# Patient Record
Sex: Female | Born: 1935 | Race: Black or African American | Hispanic: No | Smoking: Never smoker
Health system: Southern US, Community
[De-identification: ages and names within clinical notes are randomized; demographics above are authoritative.]

## PROBLEM LIST (undated history)

## (undated) DIAGNOSIS — I10 Essential (primary) hypertension: Secondary | ICD-10-CM

## (undated) DIAGNOSIS — I6529 Occlusion and stenosis of unspecified carotid artery: Secondary | ICD-10-CM

## (undated) DIAGNOSIS — E785 Hyperlipidemia, unspecified: Secondary | ICD-10-CM

## (undated) HISTORY — DX: Hyperlipidemia, unspecified: E78.5

## (undated) HISTORY — DX: Occlusion and stenosis of unspecified carotid artery: I65.29

## (undated) HISTORY — DX: Essential (primary) hypertension: I10

## (undated) HISTORY — PX: ABDOMINAL HYSTERECTOMY: SHX81

---

## 2002-06-23 ENCOUNTER — Other Ambulatory Visit: Admission: RE | Admit: 2002-06-23 | Discharge: 2002-06-23 | Payer: Self-pay | Admitting: Family Medicine

## 2003-10-18 ENCOUNTER — Ambulatory Visit (HOSPITAL_COMMUNITY): Admission: RE | Admit: 2003-10-18 | Discharge: 2003-10-18 | Payer: Self-pay | Admitting: Gastroenterology

## 2004-09-01 ENCOUNTER — Encounter: Admission: RE | Admit: 2004-09-01 | Discharge: 2004-09-01 | Payer: Self-pay | Admitting: Family Medicine

## 2006-07-22 ENCOUNTER — Ambulatory Visit (HOSPITAL_COMMUNITY): Admission: RE | Admit: 2006-07-22 | Discharge: 2006-07-22 | Payer: Self-pay | Admitting: Gastroenterology

## 2006-07-22 ENCOUNTER — Encounter (INDEPENDENT_AMBULATORY_CARE_PROVIDER_SITE_OTHER): Payer: Self-pay | Admitting: Gastroenterology

## 2006-09-03 ENCOUNTER — Observation Stay (HOSPITAL_COMMUNITY): Admission: EM | Admit: 2006-09-03 | Discharge: 2006-09-05 | Payer: Self-pay | Admitting: *Deleted

## 2006-09-03 ENCOUNTER — Ambulatory Visit: Payer: Self-pay | Admitting: Internal Medicine

## 2006-09-04 ENCOUNTER — Ambulatory Visit: Payer: Self-pay | Admitting: Vascular Surgery

## 2006-09-04 ENCOUNTER — Encounter (INDEPENDENT_AMBULATORY_CARE_PROVIDER_SITE_OTHER): Payer: Self-pay | Admitting: Internal Medicine

## 2006-10-01 ENCOUNTER — Ambulatory Visit: Payer: Self-pay | Admitting: Vascular Surgery

## 2007-04-01 ENCOUNTER — Ambulatory Visit: Payer: Self-pay | Admitting: Vascular Surgery

## 2007-09-30 ENCOUNTER — Ambulatory Visit: Payer: Self-pay | Admitting: Vascular Surgery

## 2008-03-09 ENCOUNTER — Ambulatory Visit: Payer: Self-pay | Admitting: Vascular Surgery

## 2008-09-14 ENCOUNTER — Ambulatory Visit: Payer: Self-pay | Admitting: Vascular Surgery

## 2009-03-31 ENCOUNTER — Ambulatory Visit: Payer: Self-pay | Admitting: Vascular Surgery

## 2009-10-12 ENCOUNTER — Ambulatory Visit: Payer: Self-pay | Admitting: Vascular Surgery

## 2010-04-02 ENCOUNTER — Emergency Department (HOSPITAL_COMMUNITY): Payer: No Typology Code available for payment source

## 2010-04-02 ENCOUNTER — Emergency Department (HOSPITAL_COMMUNITY)
Admission: EM | Admit: 2010-04-02 | Discharge: 2010-04-02 | Disposition: A | Discharge status: Home / Self Care | Payer: No Typology Code available for payment source | Attending: Emergency Medicine | Admitting: Emergency Medicine

## 2010-04-02 DIAGNOSIS — S0990XA Unspecified injury of head, initial encounter: Secondary | ICD-10-CM | POA: Insufficient documentation

## 2010-04-02 DIAGNOSIS — Z79899 Other long term (current) drug therapy: Secondary | ICD-10-CM | POA: Insufficient documentation

## 2010-04-02 DIAGNOSIS — H409 Unspecified glaucoma: Secondary | ICD-10-CM | POA: Insufficient documentation

## 2010-04-02 DIAGNOSIS — M545 Low back pain, unspecified: Secondary | ICD-10-CM | POA: Insufficient documentation

## 2010-04-02 DIAGNOSIS — I10 Essential (primary) hypertension: Secondary | ICD-10-CM | POA: Insufficient documentation

## 2010-04-02 DIAGNOSIS — E119 Type 2 diabetes mellitus without complications: Secondary | ICD-10-CM | POA: Insufficient documentation

## 2010-04-02 DIAGNOSIS — S1093XA Contusion of unspecified part of neck, initial encounter: Secondary | ICD-10-CM | POA: Insufficient documentation

## 2010-04-02 DIAGNOSIS — S0003XA Contusion of scalp, initial encounter: Secondary | ICD-10-CM | POA: Insufficient documentation

## 2010-04-02 DIAGNOSIS — E785 Hyperlipidemia, unspecified: Secondary | ICD-10-CM | POA: Insufficient documentation

## 2010-05-23 NOTE — Consult Note (Signed)
VASCULAR SURGERY CONSULTATION   [name], [name]  DOB:  [birth year]                                       10/01/2006  CHART#:[id number]   I saw the patient in the office today in consultation concerning her  carotid disease.  She was referred by Dr. Parke Simmers.  This is a pleasant 76-  year-old right-handed woman who was admitted on September 03, 2006 to [facility] after she had an episode of syncope while having her hair cut.  She  underwent an extensive workup while in the hospital, including a carotid  duplex scan, which showed evidence of a 40-60% left carotid stenosis and  a 60-80% right carotid stenosis.  Workup also included a head CT, which  was unremarkable.  A 2D echo showed normal LV function with an EF of  65%.  It was not felt that her symptoms were related to her diabetes.  It was felt that perhaps her symptoms were related to hypotension in her  medications, and her medications have subsequently been adjusted.  She  was sent here for vascular consultation concerning her carotid disease.  Of note, she denies any history of stroke, TIAs, expressive or receptive  aphasia, or amaurosis fugax.   PAST MEDICAL HISTORY:  Adult-onset diabetes.  She does not require  insulin.  In addition, she has hypertension and hypercholesterolemia.  She denies any history of myocardial infarction or history of congestive  heart failure.   FAMILY HISTORY:  There is no history of premature cardiovascular  disease.   SOCIAL HISTORY:  She is single.  She has three children.  She does not  use tobacco.   MEDICATIONS:  Documented on the medical history form in her chart.   REVIEW OF SYSTEMS:  Documented on the medical history form in her chart.   PHYSICAL EXAMINATION:  Blood pressure is 151/80 on the left and 141/71on  the right.  Heart rate is 108.  She has a right carotid bruit.  Lungs  are clear bilaterally to auscultation.  On cardiac exam, she has a  regular rate and  rhythm.  Her abdomen is soft and nontender.  I could  not palpate an aneurysm.  She has palpable femoral and dorsalis pedis  pulses bilaterally.  She has no significant lower extremity swelling.  Her neurologic exam is nonfocal.   Her carotid duplex at [facility] suggested a 60-79% right carotid  stenosis in the mid range.  Of note, the highest velocities were in the  distal internal carotid artery.  On the left side, she had a 40-60%  stenosis.   I have explained that I would generally not consider carotid  endarterectomy unless the stenosis progressed to greater than 80% or she  developed new neurologic symptoms.  I have recommended a follow-up  duplex scan in six months and will see her back at that time.  She does  know to continue taking her aspirin.  She understands we would consider  surgery sooner if she had right hemispheric symptoms.  I have reviewed  these symptoms with her.  I did reassure her that I did not think the  episode she had while getting her hair cut was related to her right  carotid stenosis.   [name], M.D.  Electronically Signed  CSD/MEDQ  D:  10/01/2006  T:  10/02/2006  Job:  [id number]

## 2010-05-23 NOTE — Procedures (Signed)
CAROTID DUPLEX EXAM   INDICATION:  Followup of known carotid artery disease.  Patient is  asymptomatic.   HISTORY:  Diabetes:  Yes.  Cardiac:  No.  Hypertension:  Yes.  Smoking:  No.  Previous Surgery:  No.  CV History:  Amaurosis Fugax No, Paresthesias No, Hemiparesis No                                       RIGHT                LEFT  Brachial systolic pressure:         132                  138  Brachial Doppler waveforms:         WNL                  WNL  Vertebral direction of flow:        Antegrade            Antegrade  DUPLEX VELOCITIES (cm/sec)  CCA peak systolic                   93                   99  ECA peak systolic                   129                  108  ICA peak systolic                   94 (P), 78 (M), 216 (D)             107  ICA end diastolic                   25 (P), 28 (M), 55 (D)              36  PLAQUE MORPHOLOGY:                  Homogeneous          Heterogeneous  PLAQUE AMOUNT:                      Minimal              Mild to  moderate.  PLAQUE LOCATION:                    ICA                  DCCA,  bifurcation, ICA   IMPRESSION:  1. Right 60-79% ICA stenosis with highest velocity taken at the distal      ICA.  2. Left 1-39% ICA stenosis (upper end of range).  3. Patent ECAs.  4. Bilateral antegrade flow in vertebral arteries.   ___________________________________________  Di Kindle. Edilia Bo, M.D.   PB/MEDQ  D:  04/01/2007  T:  04/01/2007  Job:  [id number]

## 2010-05-23 NOTE — Discharge Summary (Signed)
[name], [name]              ACCOUNT NO.:  [id number]   MEDICAL RECORD NO.:  [id number]          PATIENT TYPE:  OBV   LOCATION:  4707                         FACILITY:  [facility]   PHYSICIAN:  Mobolaji B. Bakare, M.D.DATE OF BIRTH:  [birth year]   DATE OF ADMISSION:  09/03/2006  DATE OF DISCHARGE:  09/05/2006                               DISCHARGE SUMMARY   PRIMARY CARE PHYSICIAN:  Renaye Rakers, M.D.   GASTROENTEROLOGIST:  Anselmo Rod, M.D.   DATE OF ADMISSION:  36 of August 2000 and in date of discharge 28 of  August 2008.   FINAL DIAGNOSES:  1. Presyncope.  2. Right carotid artery stenosis 60-80%.  3. Left carotid artery stenosis 40-60%.  4. Diabetes mellitus.  5. Normocytic anemia.  6. Hypertension.   PROCEDURE:  1. Head CT scan done September 03, 2006, showed no acute intracranial      abnormality.  2. Chest x-ray done on September 03, 2006, showed no active      cardiopulmonary disease.  3. A 2-D echocardiogram done on September 04, 2006, showed normal left      ventricular systolic function with estimated ejection fraction of      65%.  There was increased left ventricular wall thickness and      moderate focal basilar septal hypertrophy.  The pulmonary artery      pressure was mildly increased 36 mmHg.  4. Carotid Dopplers done on the September 04, 2006, showed 60-80%      internal carotid artery stenosis (medium range of scale).External      carotid artery stenosis on the right.  Left showed 40-60% internal      carotid artery stenosis (upper range of scale), external  carotid artery stenosis.  The vertebral artery flow his antegrade  bilaterally   BRIEF HISTORY:  [name] is a pleasant 76 year old African American  female with known history of diabetes mellitus, hypertension and  hyperlipidemia all of which appears to have been fairly under control.  She was in her usual state of health until August 14, 2006, when she went  to the [employer], and while sitting on a  chair, she felt dizzy all of  a sudden, felt like she was going to pass out, but she did not pass out.  She was also sweaty.  The hair dresser called EMS.  Upon arrival by EMS,  the patient's blood glucose was 147.  The patient was then transported  to the emergency room.  On arrival in the emergency room, head CT scan  was nonacute.  The patient was oriented to time, place and person.  EKG  showed no acute ischemic changes.  She had a normal D-dimer.  She was  not short of breath.  Symptoms have completely resolved.  The patient  was admitted for evaluation on telemetry.   On the day of admission prior to going to the hair dresser, the patient  had a breakfast about 7:00 a.m. and used all her medications, Avandia  and metformin.  She went for a regular aerobics, came back about 9:30,  had some crackers and  went to hair dresser place.  She has also noted  that her fasting blood glucose has been in the mid 100s.   HOSPITAL COURSE:  #1 - PRESYNCOPE.  The patient was admitted to  telemetry.  She had sinus rhythm for the time.  She had sinus rhythm on  telemetry.  She ruled out for myocardial infarction with three sets of  negative cardiac enzymes.  EKG was nonacute, normal sinus rhythm.  Although there were Q-waves in V1 and V2.  She had normal QTC interval.  She had a 2-D echocardiogram which was unremarkable for significant  valvular disease.  Carotid Dopplers noted bilateral carotid artery  stenosis, more on the right than on the left.  The patient denied  difficulty with speech, weakness, visual changes, headaches at the time  of episode.  I do not think the carotid stenosis is responsible for the  presyncope.  Nevertheless, this needs to be followed up.  She was not  orthostatic.  Overall, the etiology of presyncope is unclear.  It is  quite possible that she had relative hypoglycemia at the hair dresser's  place.  It was noted during the course of hospitalization that blood  glucose  ranged between 103 and 118, despite the fact that we did not  resume all oral hypoglycemic agent.  There was no recurrence of symptoms  in the hospital.   #2 - BILATERAL CAROTID ARTERY STENOSIS.  The patient was noted on  presyncopal workup to have 60-80% stenosis on the right and 40-60%  stenosis on the left, report is as noted above.  I have arranged an  appointment to follow up with Dr. Edilia Bo on October 01, 2006, at 9:15  a.m.  The patient will continue with aspirin.  Fasting lipid profile is  normal with HDL of 47 and LDL of 75.  She will continue with Vytorin.   #3 - DIABETES MELLITUS.  Hemoglobin A1c was 6.1.  Blood sugar regardless  of fasting or preprandial ranged between 103 and 118 during the course  of hospitalization.  The patient was not started on oral hypoglycemic  agent.  I have instructed to check fasting blood glucose in the morning  and blood glucose at bedtime.  She will continue to hold Avandia and  metformin until she sees Dr. Parke Simmers.  She will resume Starlix 30 mg  before meals.  Further adjustment to oral hypoglycemic agent to be done  by Dr. Parke Simmers.   #4 - NORMOCYTIC ANEMIA.  The patient is undergoing anemia workup.  She  is scheduled to have colonoscopy sometimes next week by Dr. Loreta Ave.  Anemia panel done during this hospitalization revealed TIBC of 342,  ferritin level of 11, this is on the low side of normal, iron is 52,  folate greater than 20, vitamin B12 239.   #5 - HYPERTENSION.  This was controlled during the course of  hospitalization.  Has been controlled at home per patient's report.   DISCHARGE MEDICATIONS:  1. Avalide 150/25 mg one tablet daily.  2. Ecotrin 81 mg daily.  3. Multivitamin one tablet daily.  4. Starlix 60 mg 1/2 tablet before meals.  5. Vytorin 10/40 one daily.  6. Xalatan eye drops both eyes q.h.s.   DISCHARGE LABORATORY DATA:  Hemoglobin A1c 6.1, TSH 1.050, total  cholesterol 134, HDL 47, LDL 75.  Sodium 143, potassium 3.7,  BUN 11,  creatinine 0.67, AST 18, ALT 13, total protein 5.9, albumin 3.0.   DISCHARGE CONDITION:  Stable.  DISCHARGE VITAL SIGNS:  Temperature 98.1, pulse of 81, blood pressure  110/55, O2 sats 100% on room air.      Mobolaji B. Corky Downs, M.D.  Electronically Signed     MBB/MEDQ  D:  09/05/2006  T:  09/06/2006  Job:  [id number]   cc:   Renaye Rakers, M.D.  Anselmo Rod, M.D.  Di Kindle. Edilia Bo, M.D.

## 2010-05-23 NOTE — Op Note (Signed)
[name], [name]              ACCOUNT NO.:  [id number]   MEDICAL RECORD NO.:  [id number]          PATIENT TYPE:  AMB   LOCATION:  ENDO                         FACILITY:  [facility]   PHYSICIAN:  Anselmo Rod, M.D.  DATE OF BIRTH:  1934/06/29   DATE OF PROCEDURE:  07/22/2006  DATE OF DISCHARGE:  07/22/2006                               OPERATIVE REPORT   PROCEDURE:  Esophagogastroduodenoscopy with small-bowel biopsies.   ENDOSCOPIST:  Dr. Anselmo Rod.   INSTRUMENT USED:  Pentax video panendoscope.   INDICATIONS FOR PROCEDURE:  This is a 76 year old African American  female undergoing EGD for iron deficiency anemia; rule out peptic ulcer  disease, esophagitis, gastritis, etc. Small bowel biopsies are planned  to rule out sprue.   PRE-PROCEDURE PREP:  Informed consent was procured from the patient. The  patient was fasted for 8 hours prior to procedure.  The risks and  benefits of the procedure were discussed with the patient in great  detail. The patient had stable vital signs.  Neck was supple.  Chest was  clear to auscultation. S2 regular. Abdomen soft with normal bowel sounds   DESCRIPTION OF THE PROCEDURE:  The patient was placed in the left  lateral decubitus position and sedated with Fentanyl and Versed. Once  the patient was adequately sedated and maintained on low-flow oxygen and  continuous cardiac monitoring, the Pentax video panendoscope was  advanced through the mouthpiece over the tongue into the esophagus under  direct vision. The entire esophagus was widely patent with no evidence  of ring, stricture, mass, esophagitis, Barrett's mucosa.  The scope was  then advanced into the stomach. A small hiatal hernia was seen on high  retroflexion. There was a large sessile polyp that was removed by hot  snare from the midbody of the stomach and retrieved by [name] retrieval  net. The proximal small bowel appeared normal.  Small-bowel biopsies  were done to rule out  sprue. No ulcer, erosions, etc. were identified.  The patient tolerated the procedure well without immediate  complications.   IMPRESSION:  1. Normal-appearing esophagus.  2. Small hiatal hernia.  3. Large sessile polyp removed from midbody of the stomach with a hot      snare.  4. Normal proximal small-bowel biopsies done to rule out sprue.   RECOMMENDATIONS:  1. Await pathology results.  2. Avoid all nonsteroidals, including Aspirin, for the next four      weeks.  3. Proceed with colonoscopy at this time.  4. Further recommendations to be made in followup.      Anselmo Rod, M.D.  Electronically Signed     JNM/MEDQ  D:  07/23/2006  T:  07/24/2006  Job:  [id number]   cc:   Anselmo Rod, M.D.  Fax: [phone]   Renaye Rakers, M.D.  Fax: [phone]

## 2010-05-23 NOTE — Procedures (Signed)
CAROTID DUPLEX EXAM   INDICATION:  Carotid disease.   HISTORY:  Diabetes:  Yes.  Cardiac:  No.  Hypertension:  Yes.  Smoking:  No.  Previous Surgery:  No.  CV History:  Asymptomatic.  Amaurosis Fugax No, Paresthesias No, Hemiparesis No                                       RIGHT             LEFT  Brachial systolic pressure:         62                156  Brachial Doppler waveforms:         Normal            Normal  Vertebral direction of flow:        Antegrade         Antegrade  DUPLEX VELOCITIES (cm/sec)  CCA peak systolic                   148               150  ECA peak systolic                   107               78  ICA peak systolic                   205               150  ICA end diastolic                   45                42  PLAQUE MORPHOLOGY:                                    Heterogeneous  PLAQUE AMOUNT:                      None              Mild  PLAQUE LOCATION:                                      Proximal ICA   IMPRESSION:  1. Doppler velocity suggests at 60% to 79% stenosis of the right      distal internal carotid artery and a 40% to 59% stenosis of the      left distal internal carotid artery.  No focal plaque formation is      noted in the bilateral distal internal carotid arteries.  2. No significant change in the right internal carotid artery when      compared to the previous study on 03/09/2008, with the left      internal carotid artery velocities measuring higher than previously      recorded.   ___________________________________________  Di Kindle. Edilia Bo, M.D.   CH/MEDQ  D:  09/14/2008  T:  09/14/2008  Job:  [id number]

## 2010-05-23 NOTE — Assessment & Plan Note (Signed)
OFFICE VISIT   [name], [name]  DOB:  [birth year]                                       04/01/2007  CHART#:[id number]   HISTORY:  I saw the patient in the office today for continued followup  of her carotid disease.  I had initially seen her in consultation in  September of 2008 with a 60-79% right carotid stenosis.  I explained  that we generally do not consider carotid endarterectomy unless she  developed clear cut right hemispheric symptoms or the stenosis  progressed to greater than 80%.  I recommended a followup duplex scan in  6 months.  She comes in for that followup study.   Since I saw her last in September she has had no history of stroke,  TIAs, expressive or receptive aphasia or amaurosis fugax.  She continues  to take an aspirin a day.   REVIEW OF SYSTEMS:  On review of systems she has had no recent chest  pain, chest pressure, palpitations or arrhythmias.  She has had no  bronchitis, asthma or wheezing.   SOCIAL HISTORY:  Fortunately she is not a smoker.   PHYSICAL EXAMINATION:  General:  This is a pleasant 76 year old woman  who appears her stated age.  Vital signs:  Blood pressure is 142/78,  heart rate is 107.  She has a soft right carotid bruit.  Lungs:  Clear  bilaterally to auscultation.  Cardiovascular:  She has a regular rate  and rhythm.  Abdomen:  Her abdomen is soft and nontender.  Neurological:  Nonfocal.   Carotid duplex scan shows that she has a 60-79% right carotid stenosis  with no significant stenosis on the left.  The velocities on the right  have not changed significantly over the last 6 months.   As she remains asymptomatic I again explained that we would not consider  right carotid endarterectomy unless the stenosis progressed to greater  than 80%.  I will see her back in 6 months for a followup duplex scan.  Of note, if the stenosis does progress it appears that the stenosis is  fairly far distal in the internal  carotid artery and she could  potentially require preoperative cerebral arteriography to be sure that  this is surgically accessible.  I will see her back in 6 months.  She  knows to call sooner if she has problems.  In the meantime she knows to  continue taking her aspirin.   Di Kindle. Edilia Bo, M.D.  Electronically Signed   CSD/MEDQ  D:  04/01/2007  T:  04/02/2007  Job:  [id number]

## 2010-05-23 NOTE — Op Note (Signed)
NAMEJESSICAH, [name]              ACCOUNT NO.:  [id number]   MEDICAL RECORD NO.:  [id number]          PATIENT TYPE:  AMB   LOCATION:  ENDO                         FACILITY:  [facility]   PHYSICIAN:  Anselmo Rod, M.D.  DATE OF BIRTH:  [birth year]   DATE OF PROCEDURE:  07/22/2006  DATE OF DISCHARGE:  07/22/2006                               OPERATIVE REPORT   PROCEDURE:  Colonoscopy changed to a flexible sigmoidoscopy.   ENDOSCOPIST:  Anselmo Rod, M.D.   INSTRUMENT USED:  Pentax video colonoscope.   INDICATIONS FOR PROCEDURE:  This 76 year old African/American female  with a history of iron deficiency anemia underwent a screening  colonoscopy to rule out polyps, masses, etc.   PRE-PROCEDURE PREPARATION:  An informed consent was procured from the  patient. The patient was fasted for eight hours prior to the procedure  and prepped with a bottle of magnesium citrate and one gallon of  Nulytely on the night prior to the procedure.  The risks and benefits of  the procedure, including a 10% miss-rate of cancer and polyps were  discussed with the patient as well.   PRE-PROCEDURE PHYSICAL EXAMINATION:  VITAL SIGNS:  Stable.  NECK:  Supple.  CHEST:  Clear to auscultation.  HEART:  S1 and S2 regular.  ABDOMEN:  Soft, with normal bowel sounds.   DESCRIPTION OF PROCEDURE:  The patient was placed in the left lateral  decubitus position and sedated with Fentanyl and Versed. Once the  patient was adequately sedated and maintained on low-flow oxygen and  continuous cardiac monitoring, the Pentax video colonoscope was advanced  from the rectum to 20 cm with difficulty. There was solid stool in the  colon. The procedure was aborted at this point. We plan to reprep the  patient to redo the procedure at a later date. Small internal  hemorrhoids were seen on retroflexion.   IMPRESSION:  1. Incomplete preparation. The procedure aborted at 20 cm.  2. Small internal hemorrhoids seen on  retroflexion.  3. The patient will be prepped with two days of a clear liquid diet      and an additional bottle of MiraLax prior to the NuLYTELY, a couple      of days prior to the procedure. Further recommendations will be      made. I will repeat the colonoscopy as indicated.      Anselmo Rod, M.D.  Electronically Signed     JNM/MEDQ  D:  07/23/2006  T:  07/24/2006  Job:  [id number]   cc:   Renaye Rakers, M.D.  Fax: [phone]

## 2010-05-23 NOTE — Procedures (Signed)
CAROTID DUPLEX EXAM   INDICATION:  Follow up carotid artery disease.   HISTORY:  Diabetes:  Yes  Cardiac:  No  Hypertension:  Yes.  Smoking:  No  Previous Surgery:  No  CV History:  No  Amaurosis Fugax No, Paresthesias No, Hemiparesis No                                       RIGHT             LEFT  Brachial systolic pressure:         140               160  Brachial Doppler waveforms:         WNL               WNL  Vertebral direction of flow:        Antegrade         Antegrade  DUPLEX VELOCITIES (cm/sec)  CCA peak systolic                   91                101  ECA peak systolic                   128               132  ICA peak systolic                   227 distal        140 distal  ICA end diastolic                   61                38  PLAQUE MORPHOLOGY:                  Heterogenous      Heterogenous  PLAQUE AMOUNT:                      Moderate          Mild  PLAQUE LOCATION:                    ICA               ICA   IMPRESSION:  1. Right internal carotid artery suggests 60% to 79% stenosis at the      distal portion.  2. Left internal carotid artery suggests 40% to 59% stenosis at the      distal portion.  3. Antegrade flow in bilateral vertebrals.   ___________________________________________  Di Kindle. Edilia Bo, M.D.   CB/MEDQ  D:  04/01/2009  T:  04/01/2009  Job:  [id number]

## 2010-05-23 NOTE — Procedures (Signed)
CAROTID DUPLEX EXAM   INDICATION:  Follow up carotid artery disease.   HISTORY:  Diabetes:  Yes.  Cardiac:  No.  Hypertension:  Yes.  Smoking:  No.  Previous Surgery:  No.  CV History:  No.  Amaurosis Fugax No, Paresthesias No, Hemiparesis No.                                       RIGHT             LEFT  Brachial systolic pressure:         138               134  Brachial Doppler waveforms:         Triphasic         Triphasic  Vertebral direction of flow:        Antegrade         Antegrade  DUPLEX VELOCITIES (cm/sec)  CCA peak systolic                   116               116  ECA peak systolic                   140               108  ICA peak systolic                   P=69, D=210       105  ICA end diastolic                   P=27, D=61        36  PLAQUE MORPHOLOGY:                  Mixed             Calcified  PLAQUE AMOUNT:                      Moderate          Minimal  PLAQUE LOCATION:                    Distal ICA        Bifurcation/ICA   IMPRESSION:  1. Right internal carotid artery shows evidence of 60-79% stenosis      distally.  2. Left internal carotid artery shows evidence of 20-39% stenosis.  3. No significant changes from previous study.   ___________________________________________  Di Kindle. Edilia Bo, M.D.   AS/MEDQ  D:  09/30/2007  T:  09/30/2007  Job:  [id number]

## 2010-05-23 NOTE — Assessment & Plan Note (Signed)
OFFICE VISIT   DESIRE, FULP  DOB:  1934-12-22                                       10/12/2009  CHART#:[id number]   I saw the patient in the office today for continued followup of her  carotid disease.  I last saw her in March 2009.  She has been coming for  yearly followup carotid duplex scans.  Since I saw her last, she has had  no history of stroke, TIAs, expressive or receptive aphasia, or  amaurosis fugax.   PAST MEDICAL HISTORY:  Significant for adult-onset diabetes,  hypertension, and hypercholesterolemia.   SOCIAL HISTORY:  She is single.  She has 3 children.  She does not smoke  cigarettes.   REVIEW OF SYSTEMS:  CARDIOVASCULAR:  She has had no chest pain, chest  pressure, palpitations or arrhythmias.  PULMONARY:  She had no productive cough, bronchitis, asthma or wheezing.   PHYSICAL EXAMINATION:  This is a pleasant 76 year old woman who appears  her stated age.  Her blood pressure is 136/78, heart rate is 96,  respiratory rate is 12.  Lungs:  Clear bilaterally to auscultation.  Cardiovascular:  She has a soft right carotid bruit.  She has a regular  rate and rhythm.  She has palpable femoral pulses and warm and well-  perfused feet without ischemic ulcers.  Abdomen:  Soft and nontender.  Neurologic:  She has no focal weakness or paresthesias.   I did independently interpret her carotid duplex scan which shows a 40%  to 59% left carotid stenosis and a 60% to 79% right carotid stenosis.  On the right side, this is in the lower end of that range.  Both  vertebral arteries are patent with normally directed flow.   I have also reviewed her lab studies which show her most recent LDL  cholesterol was 100, HDL 68, total cholesterol 811.   Overall I am pleased with her progress.  I will continue to follow her  moderate right carotid stenosis, and I will plan on seeing her back in 1  year with a followup carotid duplex scan which I have  ordered.  She  knows to call sooner if she has problems.  In the meantime she knows to  continue taking her aspirin.     Di Kindle. Edilia Bo, M.D.  Electronically Signed   CSD/MEDQ  D:  10/12/2009  T:  10/13/2009  Job:  [id number]   cc:   Renaye Rakers, M.D.

## 2010-05-23 NOTE — H&P (Signed)
[name], [name] NO.:  [id number]   MEDICAL RECORD NO.:  [id number]          PATIENT TYPE:  OBV   LOCATION:  1833                         FACILITY:  [facility]   PHYSICIAN:  Elliot Cousin, M.D.    DATE OF BIRTH:  10-05-1934   DATE OF ADMISSION:  09/03/2006  DATE OF DISCHARGE:                              HISTORY & PHYSICAL   PRIMARY CARE PHYSICIAN:  Dr. Renaye Rakers   CHIEF COMPLAINT:  I almost passed out.   HISTORY OF PRESENT ILLNESS:  The patient is a 76 year old woman with a  past medical history significant for type 2 diabetes mellitus,  hypertension and hyperlipidemia.  She presents to the emergency  department with a chief complaint of almost passing out today while at  her hairdresser.  The patient got up this morning in her usual state of  health.  She states that she felt fine.  She went to water aerobics as  usual at 8:30 a.m.  She completed her aerobics and ate a few crackers on  the way to her hairdresser.  At around 10 a.m. this morning she arrived  to her hairdresser's shop.  At approximately 10:30 a.m. she sat in the  chair for a haircut.  All of a sudden, she became very sweaty.  Everything began to get dark.  She told her hairdresser that she felt as  if she was going to pass out.  The sensation lasted for about 20 seconds  and then she completely recovered.  The patient denies completely losing  consciousness.  She denies any falls.  She denies any preceding  headache, dizziness, vertigo, chest pain, palpitations, shortness of  breath, focal weakness, numbness, or incontinence.  She had no  complaints of nausea or vomiting.  According to the patient, when the  EMT arrived her capillary blood sugar was 147.  The patient says that  she has not had low blood sugars in approximately 2 years.  Her blood  glucose is otherwise  controlled on Glucophage and Avandia.   During the evaluation in the emergency department the patient is noted  to be  hemodynamically stable.  She is afebrile.  Her chest x-ray reveals  no acute findings.  Her EKG reveals normal sinus rhythm with  questionable Q waves in the septal leads.  Heart rate 92 beats per  minute.  The CT scan of her head reveals no acute abnormalities.  Her  lab data are significant for a hemoglobin of 10.9, a D-dimer of 0.29  which is within normal limits, and an urinalysis that reveals mild  leukocytes.  The patient has had no complaints of painful urination.  The patient will be admitted for 24-hour observation and further  evaluation.   PAST MEDICAL HISTORY:  1. Type 2 diabetes mellitus.  2. Hypertension.  3. Hyperlipidemia.  4. Glaucoma.  5. Hiatal hernia, stomach polyp per EGD by Dr. Loreta Ave July 2008.  The      pathology report revealed a hyperplastic polyp.  6. Small hemorrhoids per incomplete colonoscopy July 2008 by Dr. Loreta Ave.      The patient has  a followup colonoscopy scheduled next week.  7. Status post hysterectomy secondary to fibroids in 1983.   MEDICATIONS:  1. Avalide 150/25 mg daily.  2. Avandia 2 mg daily.  3. Ecotrin 81 mg daily.  4. Metformin 1000 mg b.i.d.  5. Multivitamin once daily.  6. Starlix 60 mg one-half tablet before meals.  7. Vytorin 10/40 mg daily.  8. Xalatan eyedrops one drop in both eyes q.h.s.   ALLERGIES:  No known drug allergies.   SOCIAL HISTORY:  The patient is divorced.  [place]  She has three children.  She is retired.  She denies  tobacco, alcohol, and illicit drug use.   FAMILY HISTORY:  Her mother died of old age at 38 years of age.  Her  father died of TB at 42 years of age.   REVIEW OF SYSTEMS:  Otherwise negative.   EXAMINATION:  Temperature 97.3, blood pressure 156/82, pulse 96,  respiratory rate 24, oxygen saturation 99% on room air.  GENERAL:  The patient is a pleasant 76 year old African American woman  who is currently sitting up in bed, alert and in no acute distress.  HEENT:   Head is normocephalic, nontraumatic.  Pupils equal, round, and  reactive to light.  Extraocular movements are intact.  Conjunctivae are  clear.  Sclerae are white.  Tympanic membranes are clear bilaterally.  Nasal mucosa is mildly dry, no sinus tenderness.  Oropharynx reveals  fair dentition.  Mucous membranes are mildly dry.  No posterior exudates  or erythema.  NECK:  Supple.  No adenopathy, no thyromegaly, no bruit, no JVD.  LUNGS:  Occasional crackle in the bases, otherwise clear.  Breathing  nonlabored.  HEART:  S1, S2 with a 1/6 to 2/6 systolic murmur.  ABDOMEN:  Positive bowel sounds, mildly obese, soft, nontender,  nondistended, no hepatosplenomegaly, no masses palpated.  EXTREMITIES:  Pedal pulses are palpable bilaterally.  No pretibial edema  and no pedal edema.  NEUROLOGIC:  The patient is alert and oriented x3.  Cranial nerves II-  XII are intact.  Strength is 5/5 throughout in the sitting position.  Sensation is grossly intact.  Gait not assessed.  Cerebellar with finger-  to-nose within normal limits bilaterally.  No pronator drift.  No  nystagmus.   ADMISSION LABORATORIES:  CK-MB less than 1, troponin I less than 0.05,  myoglobin 44.8.  WBC 8.5, hemoglobin 10.9, platelets 278.  Urinalysis:  Small leukocyte esterase, 0-2 wbc's, rare bacteria.  Sodium 130,  potassium 4.2, chloride 106, BUN 14, creatinine 0.7, bicarbonate 25,  glucose 102.   ASSESSMENT:  1. Presyncope.  Etiology unclear at this time.  Consider the following      potential etiologies:  Vasovagal, hypoglycemia, TIA, arrhythmia.      The patient is currently neurologically intact.  Her head CT is      nonacute.  Given that she has no focal neurological findings and/or      deficits, I believe the yield would not be very high if an MRI of      the brain is ordered.  Will therefore, however, order carotid      Dopplers and a 2-D echocardiogram for further evaluation.  2. Normocytic anemia.  The patient  recently underwent a GI evaluation      by Dr. Loreta Ave for anemia.  The results are above.  The patient has      another colonoscopy scheduled next week as the first colonoscopy  was incomplete secondary to poor preparation.  3. Hypertension.  The patient is chronically treated with Avalide.  4. Hyperlipidemia.  The patient is chronically treated with Vytorin.  5. Type 2 diabetes mellitus.  The patient is chronically treated with      Avandia, metformin and Starlix.  Her venous glucose is 102.  As      stated above, hypoglycemia is a concern.   PLAN:  1. The patient will be admitted for further evaluation and management.      She will be admitted under 24-hour observation.  2. Will check her neurological status every 4 hours x24 hours.  3. For further evaluation, will check carotid Dopplers, 2-D      echocardiogram, cardiac enzymes, and a TSH.  4. Will check orthostatic vital signs.  5. Will monitor the patient's capillary blood glucose closely.  Will      hold all oral diabetic medications and treat the patient's      hyperglycemia, if any, with sliding scale NovoLog.  6. Will also assess the patient's hemoglobin A1c and fasting lipid      panel.      Elliot Cousin, M.D.  Electronically Signed     DF/MEDQ  D:  09/03/2006  T:  09/03/2006  Job:  [id number]   cc:   Renaye Rakers, M.D.

## 2010-05-23 NOTE — Procedures (Signed)
CAROTID DUPLEX EXAM   INDICATION:  Follow-up evaluation of known carotid artery disease.   HISTORY:  Diabetes:  Yes.  Cardiac:  No.  Hypertension:  Yes.  Smoking:  No.  Previous Surgery:  No.  CV History:  Previous duplex on 09/30/07 revealed 60-79% right ICA  stenosis and 20-39% left ICA stenosis.  Amaurosis Fugax No, Paresthesias No, Hemiparesis No.                                       RIGHT             LEFT  Brachial systolic pressure:         160               158  Brachial Doppler waveforms:         Triphasic         Triphasic  Vertebral direction of flow:        Antegrade         Antegrade  DUPLEX VELOCITIES (cm/sec)  CCA peak systolic                   84                112  ECA peak systolic                   156               125  ICA peak systolic                   83                105  ICA end diastolic                   33                35  PLAQUE MORPHOLOGY:                  Soft              Mixed  PLAQUE AMOUNT:                      Mild              Mild  PLAQUE LOCATION:                    Proximal ICA      Proximal ICA   IMPRESSION:  1. Elevated velocities in the distal right internal carotid artery      suggest (192 cm/s peak systolic velocity, 58 cm/s end diastolic      velocity ) a 40-59% stenosis (high end of range).  2. 20-39% left internal carotid artery stenosis.  3. No significant change from previous study performed on 09/30/07.   ___________________________________________  Di Kindle. Edilia Bo, M.D.   MC/MEDQ  D:  03/09/2008  T:  03/09/2008  Job:  [id number]

## 2010-05-23 NOTE — Procedures (Signed)
CAROTID DUPLEX EXAM   INDICATION:  Follow up carotid artery disease.   HISTORY:  Diabetes:  Yes.  Cardiac:  No.  Hypertension:  Yes.  Smoking:  No.  Previous Surgery:  No.  CV History:  No.  Amaurosis Fugax , Paresthesias , Hemiparesis                                       RIGHT             LEFT  Brachial systolic pressure:         140               148  Brachial Doppler waveforms:         WNL               WNL  Vertebral direction of flow:        Antegrade         Antegrade  DUPLEX VELOCITIES (cm/sec)  CCA peak systolic                   88                109  ECA peak systolic                   112               115  ICA peak systolic                   207               94  ICA end diastolic                   44                23  PLAQUE MORPHOLOGY:                  Heterogenous      Heterogenous  PLAQUE AMOUNT:                      Moderate          Moderate  PLAQUE LOCATION:                    Bifurcation, ICA  Bifurcation, ICA   IMPRESSION:  1. Right internal carotid artery suggests 60-79% stenosis at the      distal portion of the internal carotid artery.  2. The left internal carotid artery suggests 1-39% stenosis at the      distal portion.  3. Bilateral antegrade vertebrals.  4. Bilateral intimal thickening within the common carotid arteries.       ___________________________________________  Di Kindle. Edilia Bo, M.D.   OD/MEDQ  D:  10/12/2009  T:  10/12/2009  Job:  [id number]

## 2010-05-26 NOTE — Op Note (Signed)
[name], [name]              ACCOUNT NO.:  [id number]   MEDICAL RECORD NO.:  [id number]          PATIENT TYPE:  AMB   LOCATION:  ENDO                         FACILITY:  [facility]   PHYSICIAN:  Anselmo Rod, M.D.  DATE OF BIRTH:  04-08-1934   DATE OF PROCEDURE:  10/18/2003  DATE OF DISCHARGE:                                 OPERATIVE REPORT   PROCEDURE PERFORMED:  Colonoscopy.   ENDOSCOPIST:  Charna Elizabeth, M.D.   INSTRUMENT USED:  Olympus video colonoscope.   INDICATIONS FOR PROCEDURE:  The patient is a 76 year old African-American  female undergone screening colonoscopy to rule out colonic polyps, masses,  etc.   PREPROCEDURE PREPARATION:  Informed consent was procured from the patient.  The patient was fasted for eight hours prior to the procedure and prepped  with a bottle of magnesium citrate and a gallon of GoLYTELY the night prior  to the procedure.   PREPROCEDURE PHYSICAL:  The patient had stable vital signs.  Neck supple.  Chest clear to auscultation.  S1 and S2 regular.  Abdomen soft with normal  bowel sounds.   DESCRIPTION OF PROCEDURE:  The patient was placed in left lateral decubitus  position and sedated with 70 mg of Demerol and 7 mg of Versed in slow  incremental doses.  Once the patient was adequately sedated and maintained  on low flow oxygen and continuous cardiac monitoring, the Olympus video  colonoscope was advanced from the rectum to the cecum.  The appendicular  orifice and ileocecal valve were clearly visualized and photographed.  The  patient's position was changed from the left lateral to the supine and the  right lateral position with gentle application of abdominal pressure to  reach the cecum.  The patient had evidence of sigmoid diverticulosis with  small internal hemorrhoids seen on retroflexion.  No masses or polyps were  identified.  The procedure was complete up to the cecum.  The ileocecal  valve and the appendicular orifice were  visualized and photographed.   IMPRESSION:  1.  Small nonbleeding internal hemorrhoids.  2.  Sigmoid diverticulosis.  3.  No masses or polyps were seen.   RECOMMENDATIONS:  1.  Continue high fiber diet with liberal fluid intake.  Brochures on      diverticulosis have been given to the patient      for her education.  2.  Outpatient follow-up as need arises in the future.  Repeat colonoscopy      has been recommended in the next 10 years unless the patient develops      any abnormal symptoms in the interim.       JNM/MEDQ  D:  10/18/2003  T:  10/18/2003  Job:  [id number]   cc:   Renaye Rakers, M.D.  [facility]  [place]  [place] [address]  Fax: [phone]

## 2010-08-02 ENCOUNTER — Encounter: Payer: Self-pay | Admitting: Podiatry

## 2010-08-02 DIAGNOSIS — E119 Type 2 diabetes mellitus without complications: Secondary | ICD-10-CM | POA: Insufficient documentation

## 2010-08-31 ENCOUNTER — Encounter: Payer: Self-pay | Admitting: Vascular Surgery

## 2010-10-11 ENCOUNTER — Ambulatory Visit: Payer: Self-pay | Admitting: Vascular Surgery

## 2010-10-11 ENCOUNTER — Other Ambulatory Visit: Payer: Self-pay

## 2010-10-20 LAB — LIPID PANEL: Cholesterol: 134

## 2010-10-20 LAB — URINE CULTURE

## 2010-10-20 LAB — CBC
Hemoglobin: 10.2 — ABNORMAL LOW
MCHC: 33.9
MCHC: 34
MCV: 88.2
Platelets: 278
RBC: 3.39 — ABNORMAL LOW
RBC: 3.69 — ABNORMAL LOW
WBC: 6.1

## 2010-10-20 LAB — FERRITIN: Ferritin: 11 (ref 10–291)

## 2010-10-20 LAB — DIFFERENTIAL
Basophils Absolute: 0
Eosinophils Absolute: 0
Lymphocytes Relative: 16
Lymphs Abs: 1.4
Monocytes Absolute: 0.4
Monocytes Relative: 5
Neutro Abs: 6.6

## 2010-10-20 LAB — COMPREHENSIVE METABOLIC PANEL
ALT: 13
AST: 18
CO2: 29
Calcium: 8.9
Chloride: 108
Creatinine, Ser: 0.67
GFR calc Af Amer: >60
GFR calc non Af Amer: >60
Glucose, Bld: 103 — ABNORMAL HIGH
Total Bilirubin: 0.3

## 2010-10-20 LAB — I-STAT 8, (EC8 V) (CONVERTED LAB)
Bicarbonate: 25.6 — ABNORMAL HIGH
HCT: 35 — ABNORMAL LOW
Potassium: 4.2
Sodium: 138
TCO2: 27
pCO2, Ven: 39.8 — ABNORMAL LOW
pH, Ven: 7.416 — ABNORMAL HIGH

## 2010-10-20 LAB — FOLATE: Folate: >20

## 2010-10-20 LAB — CARDIAC PANEL(CRET KIN+CKTOT+MB+TROPI)
CK, MB: 0.7
CK, MB: 0.8
Relative Index: INVALID
Total CK: 95
Troponin I: 0.02
Troponin I: 0.02

## 2010-10-20 LAB — CK TOTAL AND CKMB (NOT AT ARMC): CK, MB: 1

## 2010-10-20 LAB — POCT CARDIAC MARKERS
CKMB, poc: <1 — ABNORMAL LOW
Operator id: 288831

## 2010-10-20 LAB — B-NATRIURETIC PEPTIDE (CONVERTED LAB): Pro B Natriuretic peptide (BNP): 33

## 2010-10-20 LAB — POCT I-STAT CREATININE: Creatinine, Ser: 0.7

## 2010-10-20 LAB — URINALYSIS, ROUTINE W REFLEX MICROSCOPIC
Glucose, UA: NEGATIVE
pH: 7.5

## 2010-10-20 LAB — IRON AND TIBC
Saturation Ratios: 15 — ABNORMAL LOW
TIBC: 342

## 2010-10-20 LAB — RETICULOCYTES: Retic Ct Pct: 1.2

## 2010-10-20 LAB — URINE MICROSCOPIC-ADD ON

## 2010-11-08 ENCOUNTER — Ambulatory Visit (INDEPENDENT_AMBULATORY_CARE_PROVIDER_SITE_OTHER): Payer: Medicare Other | Admitting: *Deleted

## 2010-11-08 ENCOUNTER — Ambulatory Visit: Payer: Self-pay | Admitting: Vascular Surgery

## 2010-11-08 DIAGNOSIS — I6529 Occlusion and stenosis of unspecified carotid artery: Secondary | ICD-10-CM

## 2010-11-15 ENCOUNTER — Encounter: Payer: Self-pay | Admitting: Vascular Surgery

## 2010-11-15 NOTE — Procedures (Unsigned)
CAROTID DUPLEX EXAM  INDICATION:  Follow up carotid artery disease.  HISTORY: Diabetes:  Yes. Cardiac:  No. Hypertension:  Yes. Smoking:  No. Previous Surgery:  No. CV History: Amaurosis Fugax No, Paresthesias No, Hemiparesis No.                                      RIGHT             LEFT Brachial systolic pressure:         143               146 Brachial Doppler waveforms:         Normal            Normal Vertebral direction of flow:        Antegrade         Antegrade DUPLEX VELOCITIES (cm/sec) CCA peak systolic                   87                88 ECA peak systolic                   69                78 ICA peak systolic                   88                69 ICA end diastolic                   29                22 PLAQUE MORPHOLOGY:                  Heterogenous      Heterogenous PLAQUE AMOUNT:                      Minimal           Minimal PLAQUE LOCATION:                    CCA, bifurcation  Bifurcation, ICA  IMPRESSION: 1. Right internal carotid artery velocities are suggestive of a 1% to     39% stenosis, which is less than previously recorded. 2. Left internal carotid artery velocities are suggestive of a 1% to     39% stenosis. 3. Antegrade vertebral arteries bilaterally.  ___________________________________________ Di Kindle. Edilia Bo, M.D.  EM/MEDQ  D:  11/08/2010  T:  11/08/2010  Job:  [id number]

## 2010-11-16 ENCOUNTER — Other Ambulatory Visit: Payer: Self-pay | Admitting: Vascular Surgery

## 2010-11-16 DIAGNOSIS — I6529 Occlusion and stenosis of unspecified carotid artery: Secondary | ICD-10-CM

## 2011-02-13 DIAGNOSIS — M79609 Pain in unspecified limb: Secondary | ICD-10-CM | POA: Diagnosis not present

## 2011-02-13 DIAGNOSIS — B351 Tinea unguium: Secondary | ICD-10-CM | POA: Diagnosis not present

## 2011-03-21 DIAGNOSIS — H409 Unspecified glaucoma: Secondary | ICD-10-CM | POA: Diagnosis not present

## 2011-03-21 DIAGNOSIS — H4011X0 Primary open-angle glaucoma, stage unspecified: Secondary | ICD-10-CM | POA: Diagnosis not present

## 2011-03-21 DIAGNOSIS — E119 Type 2 diabetes mellitus without complications: Secondary | ICD-10-CM | POA: Diagnosis not present

## 2011-03-21 DIAGNOSIS — H2510 Age-related nuclear cataract, unspecified eye: Secondary | ICD-10-CM | POA: Diagnosis not present

## 2011-04-16 DIAGNOSIS — E785 Hyperlipidemia, unspecified: Secondary | ICD-10-CM | POA: Diagnosis not present

## 2011-04-16 DIAGNOSIS — E1110 Type 2 diabetes mellitus with ketoacidosis without coma: Secondary | ICD-10-CM | POA: Diagnosis not present

## 2011-04-16 DIAGNOSIS — I10 Essential (primary) hypertension: Secondary | ICD-10-CM | POA: Diagnosis not present

## 2011-04-24 DIAGNOSIS — E785 Hyperlipidemia, unspecified: Secondary | ICD-10-CM | POA: Diagnosis not present

## 2011-04-24 DIAGNOSIS — E1110 Type 2 diabetes mellitus with ketoacidosis without coma: Secondary | ICD-10-CM | POA: Diagnosis not present

## 2011-05-22 DIAGNOSIS — M79609 Pain in unspecified limb: Secondary | ICD-10-CM | POA: Diagnosis not present

## 2011-05-22 DIAGNOSIS — B351 Tinea unguium: Secondary | ICD-10-CM | POA: Diagnosis not present

## 2011-08-20 DIAGNOSIS — I10 Essential (primary) hypertension: Secondary | ICD-10-CM | POA: Diagnosis not present

## 2011-08-27 DIAGNOSIS — I10 Essential (primary) hypertension: Secondary | ICD-10-CM | POA: Diagnosis not present

## 2011-08-27 DIAGNOSIS — E119 Type 2 diabetes mellitus without complications: Secondary | ICD-10-CM | POA: Diagnosis not present

## 2011-08-28 DIAGNOSIS — M79609 Pain in unspecified limb: Secondary | ICD-10-CM | POA: Diagnosis not present

## 2011-08-28 DIAGNOSIS — B351 Tinea unguium: Secondary | ICD-10-CM | POA: Diagnosis not present

## 2011-09-20 DIAGNOSIS — H04129 Dry eye syndrome of unspecified lacrimal gland: Secondary | ICD-10-CM | POA: Diagnosis not present

## 2011-09-20 DIAGNOSIS — H2510 Age-related nuclear cataract, unspecified eye: Secondary | ICD-10-CM | POA: Diagnosis not present

## 2011-09-20 DIAGNOSIS — H4011X0 Primary open-angle glaucoma, stage unspecified: Secondary | ICD-10-CM | POA: Diagnosis not present

## 2011-10-29 DIAGNOSIS — Z23 Encounter for immunization: Secondary | ICD-10-CM | POA: Diagnosis not present

## 2011-11-13 ENCOUNTER — Encounter: Payer: Self-pay | Admitting: Neurosurgery

## 2011-11-14 ENCOUNTER — Other Ambulatory Visit (INDEPENDENT_AMBULATORY_CARE_PROVIDER_SITE_OTHER): Payer: Medicare Other | Admitting: *Deleted

## 2011-11-14 ENCOUNTER — Encounter: Payer: Self-pay | Admitting: Neurosurgery

## 2011-11-14 ENCOUNTER — Ambulatory Visit (INDEPENDENT_AMBULATORY_CARE_PROVIDER_SITE_OTHER): Payer: Medicare Other | Admitting: Neurosurgery

## 2011-11-14 VITALS — BP 126/69 | HR 80 | Resp 16 | Ht 69.0 in | Wt 171.0 lb

## 2011-11-14 DIAGNOSIS — I6529 Occlusion and stenosis of unspecified carotid artery: Secondary | ICD-10-CM | POA: Diagnosis not present

## 2011-11-14 NOTE — Progress Notes (Signed)
[facility] Carotid Office Note  CC: Carotid stenosis Referring Physician: Edilia Bo  History of Present Illness: 77 year old female patient of Dr. Edilia Bo with no history of carotid intervention. The patient denies any signs or symptoms of CVA, TIA, amaurosis fugax or any neural deficit.  Past Medical History  Diagnosis Date  . Diabetes mellitus   . Hyperlipidemia   . Hypertension   . Carotid artery occlusion     ROS: [x]  Positive   [ ]  Denies    General: [ ]  Weight loss, [ ]  Fever, [ ]  chills Neurologic: [ ]  Dizziness, [ ]  Blackouts, [ ]  Seizure [ ]  Stroke, [ ]  "Mini stroke", [ ]  Slurred speech, [ ]  Temporary blindness; [ ]  weakness in arms or legs, [ ]  Hoarseness Cardiac: [ ]  Chest pain/pressure, [ ]  Shortness of breath at rest [ ]  Shortness of breath with exertion, [ ]  Atrial fibrillation or irregular heartbeat Vascular: [ ]  Pain in legs with walking, [ ]  Pain in legs at rest, [ ]  Pain in legs at night,  [ ]  Non-healing ulcer, [ ]  Blood clot in vein/DVT,   Pulmonary: [ ]  Home oxygen, [ ]  Productive cough, [ ]  Coughing up blood, [ ]  Asthma,  [ ]  Wheezing Musculoskeletal:  [ ]  Arthritis, [ ]  Low back pain, [ ]  Joint pain Hematologic: [ ]  Easy Bruising, [ ]  Anemia; [ ]  Hepatitis Gastrointestinal: [ ]  Blood in stool, [ ]  Gastroesophageal Reflux/heartburn, [ ]  Trouble swallowing Urinary: [ ]  chronic Kidney disease, [ ]  on HD - [ ]  MWF or [ ]  TTHS, [ ]  Burning with urination, [ ]  Difficulty urinating Skin: [ ]  Rashes, [ ]  Wounds Psychological: [ ]  Anxiety, [ ]  Depression   Social History History  Substance Use Topics  . Smoking status: Never Smoker   . Smokeless tobacco: Not on file  . Alcohol Use: No    Family History Family History  Problem Relation Age of Onset  . Hyperlipidemia Mother   . Hypertension Mother     No Known Allergies  Current Outpatient Prescriptions  Medication Sig Dispense Refill  . aspirin 81 MG tablet Take 81 mg by  mouth daily.      . dorzolamide-timolol (COSOPT) 22.3-6.8 MG/ML ophthalmic solution Twice daily.      Marland Kitchen ezetimibe-simvastatin (VYTORIN) 10-40 MG per tablet Take 1 tablet by mouth at bedtime.        . irbesartan-hydrochlorothiazide (AVALIDE) 150-12.5 MG per tablet Take 1 tablet by mouth daily.        Marland Kitchen latanoprost (XALATAN) 0.005 % ophthalmic solution 1 drop at bedtime.        . Multiple Vitamin (MULTIVITAMIN) tablet Take 1 tablet by mouth daily.        . nateglinide (STARLIX) 60 MG tablet Take 60 mg by mouth 3 (three) times daily before meals.        . niacin (NIASPAN) 500 MG CR tablet Take 500 mg by mouth at bedtime.        . simvastatin (ZOCOR) 40 MG tablet daily.      . sitaGLIPtan-metformin (JANUMET) 50-1000 MG per tablet Take 1 tablet by mouth 2 (two) times daily with a meal.        . aspirin 325 MG EC tablet Take 325 mg by mouth daily.        . calcium-vitamin D (OSCAL WITH D) 500-200 MG-UNIT per tablet Take 1 tablet by mouth daily.          Physical  Examination  Filed Vitals:   11/14/11 1105  BP: 126/69  Pulse: 80  Resp:     Body mass index is 25.25 kg/(m^2).  General:  WDWN in NAD Gait: Normal HEENT: WNL Eyes: Pupils equal Pulmonary: normal non-labored breathing , without Rales, rhonchi,  wheezing Cardiac: RRR, without  Murmurs, rubs or gallops; Abdomen: soft, NT, no masses Skin: no rashes, ulcers noted  Vascular Exam Pulses: 3+ radial pulses bilaterally Carotid bruits: Carotid pulses to auscultation no bruits are heard Extremities without ischemic changes, no Gangrene , no cellulitis; no open wounds;  Musculoskeletal: no muscle wasting or atrophy   Neurologic: A&O X 3; Appropriate Affect ; SENSATION: normal; MOTOR FUNCTION:  moving all extremities equally. Speech is fluent/normal  Non-Invasive Vascular Imaging CAROTID DUPLEX 11/14/2011  Right ICA 40 - 59 % stenosis Left ICA 40 - 59 % stenosis Carotid stenosis is questionable versus fibromuscular  dysplasia  ASSESSMENT/PLAN: Asymptomatic patient will followup in one year with repeat carotid duplex. The patient's questions were encouraged and answered, she is in agreement with this plan.  Lauree Chandler ANP   Clinic MD: Edilia Bo

## 2011-11-14 NOTE — Addendum Note (Signed)
Addended by: Sharee Pimple on: 11/14/2011 01:19 PM   Modules accepted: Orders

## 2011-11-20 DIAGNOSIS — B351 Tinea unguium: Secondary | ICD-10-CM | POA: Diagnosis not present

## 2011-11-20 DIAGNOSIS — M79609 Pain in unspecified limb: Secondary | ICD-10-CM | POA: Diagnosis not present

## 2011-11-26 DIAGNOSIS — Z1231 Encounter for screening mammogram for malignant neoplasm of breast: Secondary | ICD-10-CM | POA: Diagnosis not present

## 2011-12-13 DIAGNOSIS — I10 Essential (primary) hypertension: Secondary | ICD-10-CM | POA: Diagnosis not present

## 2011-12-13 DIAGNOSIS — E119 Type 2 diabetes mellitus without complications: Secondary | ICD-10-CM | POA: Diagnosis not present

## 2011-12-24 DIAGNOSIS — E785 Hyperlipidemia, unspecified: Secondary | ICD-10-CM | POA: Diagnosis not present

## 2011-12-24 DIAGNOSIS — E781 Pure hyperglyceridemia: Secondary | ICD-10-CM | POA: Diagnosis not present

## 2011-12-24 DIAGNOSIS — I10 Essential (primary) hypertension: Secondary | ICD-10-CM | POA: Diagnosis not present

## 2011-12-24 DIAGNOSIS — E119 Type 2 diabetes mellitus without complications: Secondary | ICD-10-CM | POA: Diagnosis not present

## 2012-02-26 DIAGNOSIS — B351 Tinea unguium: Secondary | ICD-10-CM | POA: Diagnosis not present

## 2012-02-26 DIAGNOSIS — M79609 Pain in unspecified limb: Secondary | ICD-10-CM | POA: Diagnosis not present

## 2012-03-18 DIAGNOSIS — E119 Type 2 diabetes mellitus without complications: Secondary | ICD-10-CM | POA: Diagnosis not present

## 2012-03-18 DIAGNOSIS — H2510 Age-related nuclear cataract, unspecified eye: Secondary | ICD-10-CM | POA: Diagnosis not present

## 2012-03-18 DIAGNOSIS — H4011X0 Primary open-angle glaucoma, stage unspecified: Secondary | ICD-10-CM | POA: Diagnosis not present

## 2012-04-15 DIAGNOSIS — I10 Essential (primary) hypertension: Secondary | ICD-10-CM | POA: Diagnosis not present

## 2012-04-15 DIAGNOSIS — E119 Type 2 diabetes mellitus without complications: Secondary | ICD-10-CM | POA: Diagnosis not present

## 2012-04-22 DIAGNOSIS — I10 Essential (primary) hypertension: Secondary | ICD-10-CM | POA: Diagnosis not present

## 2012-04-22 DIAGNOSIS — E119 Type 2 diabetes mellitus without complications: Secondary | ICD-10-CM | POA: Diagnosis not present

## 2012-05-20 DIAGNOSIS — B351 Tinea unguium: Secondary | ICD-10-CM | POA: Diagnosis not present

## 2012-05-20 DIAGNOSIS — M79609 Pain in unspecified limb: Secondary | ICD-10-CM | POA: Diagnosis not present

## 2012-08-11 DIAGNOSIS — I10 Essential (primary) hypertension: Secondary | ICD-10-CM | POA: Diagnosis not present

## 2012-08-11 DIAGNOSIS — I951 Orthostatic hypotension: Secondary | ICD-10-CM | POA: Diagnosis not present

## 2012-08-11 DIAGNOSIS — R634 Abnormal weight loss: Secondary | ICD-10-CM | POA: Diagnosis not present

## 2012-08-11 DIAGNOSIS — E119 Type 2 diabetes mellitus without complications: Secondary | ICD-10-CM | POA: Diagnosis not present

## 2012-08-20 DIAGNOSIS — I10 Essential (primary) hypertension: Secondary | ICD-10-CM | POA: Diagnosis not present

## 2012-08-20 DIAGNOSIS — E119 Type 2 diabetes mellitus without complications: Secondary | ICD-10-CM | POA: Diagnosis not present

## 2012-08-21 ENCOUNTER — Other Ambulatory Visit: Payer: Self-pay | Admitting: Family Medicine

## 2012-08-21 ENCOUNTER — Other Ambulatory Visit: Payer: Medicare Other

## 2012-08-21 DIAGNOSIS — R748 Abnormal levels of other serum enzymes: Secondary | ICD-10-CM

## 2012-08-21 DIAGNOSIS — E876 Hypokalemia: Secondary | ICD-10-CM

## 2012-08-21 DIAGNOSIS — R634 Abnormal weight loss: Secondary | ICD-10-CM

## 2012-08-22 ENCOUNTER — Ambulatory Visit
Admission: RE | Admit: 2012-08-22 | Discharge: 2012-08-22 | Disposition: A | Discharge status: Home / Self Care | Payer: Medicare Other | Source: Ambulatory Visit | Attending: Family Medicine | Admitting: Family Medicine

## 2012-08-22 DIAGNOSIS — R748 Abnormal levels of other serum enzymes: Secondary | ICD-10-CM | POA: Diagnosis not present

## 2012-08-22 DIAGNOSIS — E876 Hypokalemia: Secondary | ICD-10-CM

## 2012-08-22 DIAGNOSIS — R634 Abnormal weight loss: Secondary | ICD-10-CM

## 2012-08-22 MED ORDER — IOHEXOL 300 MG/ML  SOLN
100.0000 mL | Freq: Once | INTRAMUSCULAR | Status: AC | PRN
  Administered 2012-08-22: 100 mL via INTRAVENOUS

## 2012-09-03 DIAGNOSIS — I10 Essential (primary) hypertension: Secondary | ICD-10-CM | POA: Diagnosis not present

## 2012-09-03 DIAGNOSIS — E119 Type 2 diabetes mellitus without complications: Secondary | ICD-10-CM | POA: Diagnosis not present

## 2012-09-15 DIAGNOSIS — E119 Type 2 diabetes mellitus without complications: Secondary | ICD-10-CM | POA: Diagnosis not present

## 2012-09-15 DIAGNOSIS — T7840XA Allergy, unspecified, initial encounter: Secondary | ICD-10-CM | POA: Diagnosis not present

## 2012-09-23 DIAGNOSIS — H2510 Age-related nuclear cataract, unspecified eye: Secondary | ICD-10-CM | POA: Diagnosis not present

## 2012-09-23 DIAGNOSIS — E119 Type 2 diabetes mellitus without complications: Secondary | ICD-10-CM | POA: Diagnosis not present

## 2012-09-23 DIAGNOSIS — H04129 Dry eye syndrome of unspecified lacrimal gland: Secondary | ICD-10-CM | POA: Diagnosis not present

## 2012-09-23 DIAGNOSIS — H4011X0 Primary open-angle glaucoma, stage unspecified: Secondary | ICD-10-CM | POA: Diagnosis not present

## 2012-10-03 DIAGNOSIS — I10 Essential (primary) hypertension: Secondary | ICD-10-CM | POA: Diagnosis not present

## 2012-10-03 DIAGNOSIS — E7800 Pure hypercholesterolemia, unspecified: Secondary | ICD-10-CM | POA: Diagnosis not present

## 2012-10-03 DIAGNOSIS — E119 Type 2 diabetes mellitus without complications: Secondary | ICD-10-CM | POA: Diagnosis not present

## 2012-10-06 DIAGNOSIS — I10 Essential (primary) hypertension: Secondary | ICD-10-CM | POA: Diagnosis not present

## 2012-10-06 DIAGNOSIS — E785 Hyperlipidemia, unspecified: Secondary | ICD-10-CM | POA: Diagnosis not present

## 2012-10-06 DIAGNOSIS — R9431 Abnormal electrocardiogram [ECG] [EKG]: Secondary | ICD-10-CM | POA: Diagnosis not present

## 2012-10-06 DIAGNOSIS — R0989 Other specified symptoms and signs involving the circulatory and respiratory systems: Secondary | ICD-10-CM | POA: Diagnosis not present

## 2012-10-20 DIAGNOSIS — R0989 Other specified symptoms and signs involving the circulatory and respiratory systems: Secondary | ICD-10-CM | POA: Diagnosis not present

## 2012-10-20 DIAGNOSIS — E119 Type 2 diabetes mellitus without complications: Secondary | ICD-10-CM | POA: Diagnosis not present

## 2012-10-22 DIAGNOSIS — E119 Type 2 diabetes mellitus without complications: Secondary | ICD-10-CM | POA: Diagnosis not present

## 2012-10-22 DIAGNOSIS — R0989 Other specified symptoms and signs involving the circulatory and respiratory systems: Secondary | ICD-10-CM | POA: Diagnosis not present

## 2012-11-04 DIAGNOSIS — I10 Essential (primary) hypertension: Secondary | ICD-10-CM | POA: Diagnosis not present

## 2012-11-04 DIAGNOSIS — E119 Type 2 diabetes mellitus without complications: Secondary | ICD-10-CM | POA: Diagnosis not present

## 2012-11-12 ENCOUNTER — Encounter: Payer: Self-pay | Admitting: Vascular Surgery

## 2012-11-13 ENCOUNTER — Encounter: Payer: Self-pay | Admitting: Family

## 2012-11-13 ENCOUNTER — Ambulatory Visit (HOSPITAL_COMMUNITY)
Admission: RE | Admit: 2012-11-13 | Discharge: 2012-11-13 | Disposition: A | Discharge status: Home / Self Care | Payer: Medicare Other | Source: Ambulatory Visit | Attending: Family | Admitting: Family

## 2012-11-13 ENCOUNTER — Ambulatory Visit (INDEPENDENT_AMBULATORY_CARE_PROVIDER_SITE_OTHER): Payer: Medicare Other | Admitting: Family

## 2012-11-13 DIAGNOSIS — I6529 Occlusion and stenosis of unspecified carotid artery: Secondary | ICD-10-CM | POA: Insufficient documentation

## 2012-11-13 NOTE — Patient Instructions (Signed)
Stroke Prevention Some medical conditions and behaviors are associated with an increased chance of having a stroke. You may prevent a stroke by making healthy choices and managing medical conditions. Reduce your risk of having a stroke by:  Staying physically active. Get at least 30 minutes of activity on most or all days.  Not smoking. It may also be helpful to avoid exposure to secondhand smoke.  Limiting alcohol use. Moderate alcohol use is considered to be:  No more than 2 drinks per day for men.  No more than 1 drink per day for nonpregnant women.  Eating healthy foods.  Include 5 or more servings of fruits and vegetables a day.  Certain diets may be prescribed to address high blood pressure, high cholesterol, diabetes, or obesity.  Managing your cholesterol levels.  A low-saturated fat, low-trans fat, low-cholesterol, and high-fiber diet may control cholesterol levels.  Take any prescribed medicines to control cholesterol as directed by your caregiver.  Managing your diabetes.  A controlled-carbohydrate, controlled-sugar diet is recommended to manage diabetes.  Take any prescribed medicines to control diabetes as directed by your caregiver.  Controlling your high blood pressure (hypertension).  A low-salt (sodium), low-saturated fat, low-trans fat, and low-cholesterol diet is recommended to manage high blood pressure.  Take any prescribed medicines to control hypertension as directed by your caregiver.  Maintaining a healthy weight.  A reduced-calorie, low-sodium, low-saturated fat, low-trans fat, low-cholesterol diet is recommended to manage weight.  Stopping drug abuse.  Avoiding birth control pills.  Talk to your caregiver about the risks of taking birth control pills if you are over 35 years old, smoke, get migraines, or have ever had a blood clot.  Getting evaluated for sleep disorders (sleep apnea).  Talk to your caregiver about getting a sleep evaluation  if you snore a lot or have excessive sleepiness.  Taking medicines as directed by your caregiver.  For some people, aspirin or blood thinners (anticoagulants) are helpful in reducing the risk of forming abnormal blood clots that can lead to stroke. If you have the irregular heart rhythm of atrial fibrillation, you should be on a blood thinner unless there is a good reason you cannot take them.  Understand all your medicine instructions. SEEK IMMEDIATE MEDICAL CARE IF:   You have sudden weakness or numbness of the face, arm, or leg, especially on one side of the body.  You have sudden confusion.  You have trouble speaking (aphasia) or understanding.  You have sudden trouble seeing in one or both eyes.  You have sudden trouble walking.  You have dizziness.  You have a loss of balance or coordination.  You have a sudden, severe headache with no known cause.  You have new chest pain or an irregular heartbeat. Any of these symptoms may represent a serious problem that is an emergency. Do not wait to see if the symptoms will go away. Get medical help right away. Call your local emergency services (911 in [place] Do not drive yourself to the hospital. Document Released: 02/02/2004 Document Revised: 03/19/2011 Document Reviewed: 06/27/2012 [employer] Patient Information 2014 [employer].  

## 2012-11-13 NOTE — Progress Notes (Signed)
Established Carotid Patient  History of Present Illness  [name] is a 78 y.o. female patient of Dr. Edilia Bo with no history of carotid intervention who returns today for routine carotid artery surveillance. Mild/moderate bilateral carotid stenosis was found on carotid Duplex a year ago; there was some question as to whether the stenosis could be fibromuscular dysplasia instead of atherosclerosis.   Patient has Negative history of TIA or stroke symptom.  The patient denies amaurosis fugax or monocular blindness.  The patient  denies facial drooping.  Pt. denies hemiplegia.  The patient denies receptive or expressive aphasia.  Pt. denies extremity weakness. Patient denies cardiac problems, denies claudication symptoms, denies non-healing wounds. Lost 25 pounds in the last year, denies food fear or post prandial gastric pain, denies nausea or vomiting, denies palpitations, denies loose stools; states her PCP is evaluating this. Gained back 10 pounds in the last 5 months.   Patient denies New Medical or Surgical History.  Pt Diabetic: Yes, recently diagnosed, in good control per pt. Pt smoker: non-smoker  Pt meds include: Statin : Yes ASA: Yes Other anticoagulants/antiplatelets: no   Past Medical History  Diagnosis Date  . Diabetes mellitus   . Hyperlipidemia   . Hypertension   . Carotid artery occlusion     Social History History  Substance Use Topics  . Smoking status: Never Smoker   . Smokeless tobacco: Not on file  . Alcohol Use: No    Family History Family History  Problem Relation Age of Onset  . Hyperlipidemia Mother   . Hypertension Mother     Surgical History Past Surgical History  Procedure Laterality Date  . Abdominal hysterectomy       No Known Allergies  Current Outpatient Prescriptions  Medication Sig Dispense Refill  . aspirin 325 MG EC tablet Take 325 mg by mouth daily.        Marland Kitchen aspirin 81 MG tablet Take 81 mg by mouth daily.      .  calcium-vitamin D (OSCAL WITH D) 500-200 MG-UNIT per tablet Take 1 tablet by mouth daily.        . dorzolamide-timolol (COSOPT) 22.3-6.8 MG/ML ophthalmic solution Twice daily.      Marland Kitchen ezetimibe-simvastatin (VYTORIN) 10-40 MG per tablet Take 1 tablet by mouth at bedtime.        . irbesartan-hydrochlorothiazide (AVALIDE) 150-12.5 MG per tablet Take 1 tablet by mouth daily.        Marland Kitchen latanoprost (XALATAN) 0.005 % ophthalmic solution 1 drop at bedtime.        . Multiple Vitamin (MULTIVITAMIN) tablet Take 1 tablet by mouth daily.        . nateglinide (STARLIX) 60 MG tablet Take 60 mg by mouth 3 (three) times daily before meals.        . niacin (NIASPAN) 500 MG CR tablet Take 500 mg by mouth at bedtime.        . simvastatin (ZOCOR) 40 MG tablet daily.      . sitaGLIPtan-metformin (JANUMET) 50-1000 MG per tablet Take 1 tablet by mouth 2 (two) times daily with a meal.         No current facility-administered medications for this visit.    Review of Systems : [x]  Positive   [ ]  Denies  General:[ ]  Weight loss,  [ ]  Weight gain, [ ]  Loss of appetite, [ ]  Fever, [ ]  chills  Neurologic: [ ]  Dizziness, [ ]  Blackouts, [ ]  Headaches, [ ]  Seizure [ ]  Stroke, [ ]  "  Mini stroke", [ ]  Slurred speech, [ ]  Temporary blindness;  [ ] weakness,  Ear/Nose/Throat: [ ]  Change in hearing, [ ]  Nose bleeds, [ ]  Hoarseness  Vascular:[ ]  Pain in legs with walking, [ ]  Pain in feet while lying flat , [ ]   Non-healing ulcer, [ ]  Blood clot in vein,    Pulmonary: [ ]  Home oxygen, [ ]   Productive cough, [ ]  Bronchitis, [ ]  Coughing up blood,  [ ]  Asthma, [ ]  Wheezing  Musculoskeletal:  [ ]  Arthritis, [ ]  Joint pain, [ ]  low back pain  Cardiac: [ ]  Chest pain, [ ]  Shortness of breath when lying flat, [ ]  Shortness of breath with exertion, [ ]  Palpitations, [ ]  Heart murmur, [ ]   Atrial fibrillation  Hematologic:[ ]  Easy Bruising, [ ]  Anemia; [ ]  Hepatitis  Psychiatric: [ ]   Depression, [ ]  Anxiety   Gastrointestinal: [ ]   Black stool, [ ]  Blood in stool, [ ]  Peptic ulcer disease,  [ ]  Gastroesophageal Reflux, [ ]  Trouble swallowing, [ ]  Diarrhea, [ ]  Constipation  Urinary: [ ]  chronic Kidney disease, [ ]  on HD, [ ]  Burning with urination, [ ]  Frequent urination, [ ]  Difficulty urinating;   Skin: [ ]  Rashes, [ ]  Wounds    Physical Examination  Filed Vitals:   11/13/12 1352  BP: 142/67  Pulse: 80  Resp: 16   Filed Weights   11/13/12 1352  Weight: 150 lb (68.04 kg)   Body mass index is 22.81 kg/(m^2).   General: WDWN female in NAD GAIT: normal Eyes: PERRLA Pulmonary:  CTAB, Negative  Rales, Negative rhonchi, & Negative wheezing.  Cardiac: regular Rhythm ,  Negative Murmurs.  VASCULAR EXAM Carotid Bruits Left Right   Negative Negative    Aorta is not palpable. Radial pulses are 2+ palpable and equal.                                                                                                                            LE Pulses LEFT RIGHT       FEMORAL   palpable   palpable        POPLITEAL  not palpable   not palpable       POSTERIOR TIBIAL  not palpable    palpable        DORSALIS PEDIS      ANTERIOR TIBIAL  palpable  not palpable     Gastrointestinal: soft, nontender, BS WNL, no r/g,  negative masses.  Musculoskeletal: Negative muscle atrophy/wasting. M/S 5/5 throughout, Extremities without ischemic changes.  Neurologic: A&O X 3; Appropriate Affect ; SENSATION ;normal;  Speech is normal CN 2-12 intact, Pain and light touch intact in extremities, Motor exam as listed above.   Non-Invasive Vascular Imaging CAROTID DUPLEX 11/13/2012   Right ICA: <40% stenosis. Left ICA: <40% stenosis.  Previous carotid studies on 11/14/11 demonstrated: RICA 40 - 59 % stenosis, LICA 40 - 59 % stenosis.  These  findings are Improved from previous exam  Assessment: [name] is a 78 y.o. female who presents with asymptomatic <40% Bilateral ICA stenosis. The  ICA stenosis is   Improved from previous exam. This may represent fibromuscular dysplasia. Patient denies known problem with kidney function and her blood pressure is controlled.  Plan: Follow-up in 1 year with Carotid Duplex scan.   I discussed in depth with the patient the nature of atherosclerosis, and emphasized the importance of maximal medical management including strict control of blood pressure, blood glucose, and lipid levels, obtaining regular exercise, and continued cessation of smoking.  The patient is aware that without maximal medical management the underlying atherosclerotic disease process will progress, limiting the benefit of any interventions. The patient was given information about stroke prevention and what symptoms should prompt the patient to seek immediate medical care. Thank you for allowing [place] to participate in this patient's care.  Charisse March, RN, MSN, FNP-C [facility] Office: [phone]  Clinic Physician: Darrick Penna  11/13/2012 1:55 PM

## 2012-11-18 ENCOUNTER — Ambulatory Visit (INDEPENDENT_AMBULATORY_CARE_PROVIDER_SITE_OTHER): Payer: Medicare Other | Admitting: Podiatry

## 2012-11-18 ENCOUNTER — Encounter: Payer: Self-pay | Admitting: Podiatry

## 2012-11-18 VITALS — BP 142/82 | HR 98 | Resp 16 | Ht 68.0 in | Wt 150.0 lb

## 2012-11-18 DIAGNOSIS — M79609 Pain in unspecified limb: Secondary | ICD-10-CM | POA: Diagnosis not present

## 2012-11-18 DIAGNOSIS — B351 Tinea unguium: Secondary | ICD-10-CM | POA: Diagnosis not present

## 2012-11-18 NOTE — Progress Notes (Signed)
Loralye presents today with a chief complaint of painful toenails one through 5 bilateral. She states she has lost 25 pounds.  Objective: Vital signs are stable she is alert and oriented x3. Pulses are palpable bilateral. Nails are thick yellow dystrophic clinically mycotic and sharply incurvated margins.  Assessment: Pain in limb secondary to onychomycosis and ingrown nails bilateral.  Plan: Debridement of nails 1 through 5 bilateral is cover service secondary to pain. Followup with her in 3 months.

## 2013-01-06 DIAGNOSIS — E7800 Pure hypercholesterolemia, unspecified: Secondary | ICD-10-CM | POA: Diagnosis not present

## 2013-01-06 DIAGNOSIS — E119 Type 2 diabetes mellitus without complications: Secondary | ICD-10-CM | POA: Diagnosis not present

## 2013-01-06 DIAGNOSIS — I10 Essential (primary) hypertension: Secondary | ICD-10-CM | POA: Diagnosis not present

## 2013-02-17 ENCOUNTER — Ambulatory Visit (INDEPENDENT_AMBULATORY_CARE_PROVIDER_SITE_OTHER): Payer: Medicare Other | Admitting: Podiatry

## 2013-02-17 ENCOUNTER — Encounter: Payer: Self-pay | Admitting: Podiatry

## 2013-02-17 VITALS — BP 139/71 | HR 89 | Resp 16

## 2013-02-17 DIAGNOSIS — B351 Tinea unguium: Secondary | ICD-10-CM

## 2013-02-17 DIAGNOSIS — M79609 Pain in unspecified limb: Secondary | ICD-10-CM | POA: Diagnosis not present

## 2013-02-17 NOTE — Progress Notes (Signed)
Trim toenails because they hurt when a while.  Objective: Vital signs are stable she is alert and oriented x3. Nails are thick yellow dystrophic clinically mycotic and painful palpation as well as debridement.  Assessment: Pain in limb secondary to onychomycosis 1 through 5 bilateral.  Plan: Debridement of nails 1 through 5 bilateral is a covered service.

## 2013-03-24 DIAGNOSIS — H4011X0 Primary open-angle glaucoma, stage unspecified: Secondary | ICD-10-CM | POA: Diagnosis not present

## 2013-03-24 DIAGNOSIS — H2589 Other age-related cataract: Secondary | ICD-10-CM | POA: Diagnosis not present

## 2013-04-13 DIAGNOSIS — H268 Other specified cataract: Secondary | ICD-10-CM | POA: Diagnosis not present

## 2013-04-13 DIAGNOSIS — H2589 Other age-related cataract: Secondary | ICD-10-CM | POA: Diagnosis not present

## 2013-05-04 DIAGNOSIS — I10 Essential (primary) hypertension: Secondary | ICD-10-CM | POA: Diagnosis not present

## 2013-05-04 DIAGNOSIS — E119 Type 2 diabetes mellitus without complications: Secondary | ICD-10-CM | POA: Diagnosis not present

## 2013-05-06 DIAGNOSIS — E119 Type 2 diabetes mellitus without complications: Secondary | ICD-10-CM | POA: Diagnosis not present

## 2013-05-06 DIAGNOSIS — I10 Essential (primary) hypertension: Secondary | ICD-10-CM | POA: Diagnosis not present

## 2013-05-06 DIAGNOSIS — E7800 Pure hypercholesterolemia, unspecified: Secondary | ICD-10-CM | POA: Diagnosis not present

## 2013-05-14 ENCOUNTER — Ambulatory Visit (INDEPENDENT_AMBULATORY_CARE_PROVIDER_SITE_OTHER): Payer: Medicare Other | Admitting: Podiatry

## 2013-05-14 ENCOUNTER — Encounter: Payer: Self-pay | Admitting: Podiatry

## 2013-05-14 VITALS — BP 134/72 | HR 88 | Resp 15

## 2013-05-14 DIAGNOSIS — B351 Tinea unguium: Secondary | ICD-10-CM | POA: Diagnosis not present

## 2013-05-14 DIAGNOSIS — M79609 Pain in unspecified limb: Secondary | ICD-10-CM | POA: Diagnosis not present

## 2013-05-14 NOTE — Patient Instructions (Signed)
Diabetes and Foot Care Diabetes may cause you to have problems because of poor blood supply (circulation) to your feet and legs. This may cause the skin on your feet to become thinner, break easier, and heal more slowly. Your skin may become dry, and the skin may peel and crack. You may also have nerve damage in your legs and feet causing decreased feeling in them. You may not notice minor injuries to your feet that could lead to infections or more serious problems. Taking care of your feet is one of the most important things you can do for yourself.  HOME CARE INSTRUCTIONS  Wear shoes at all times, even in the house. Do not go barefoot. Bare feet are easily injured.  Check your feet daily for blisters, cuts, and redness. If you cannot see the bottom of your feet, use a mirror or ask someone for help.  Wash your feet with warm water (do not use hot water) and mild soap. Then pat your feet and the areas between your toes until they are completely dry. Do not soak your feet as this can dry your skin.  Apply a moisturizing lotion or petroleum jelly (that does not contain alcohol and is unscented) to the skin on your feet and to dry, brittle toenails. Do not apply lotion between your toes.  Trim your toenails straight across. Do not dig under them or around the cuticle. File the edges of your nails with an emery board or nail file.  Do not cut corns or calluses or try to remove them with medicine.  Wear clean socks or stockings every day. Make sure they are not too tight. Do not wear knee-high stockings since they may decrease blood flow to your legs.  Wear shoes that fit properly and have enough cushioning. To break in new shoes, wear them for just a few hours a day. This prevents you from injuring your feet. Always look in your shoes before you put them on to be sure there are no objects inside.  Do not cross your legs. This may decrease the blood flow to your feet.  If you find a minor scrape,  cut, or break in the skin on your feet, keep it and the skin around it clean and dry. These areas may be cleansed with mild soap and water. Do not cleanse the area with peroxide, alcohol, or iodine.  When you remove an adhesive bandage, be sure not to damage the skin around it.  If you have a wound, look at it several times a day to make sure it is healing.  Do not use heating pads or hot water bottles. They may burn your skin. If you have lost feeling in your feet or legs, you may not know it is happening until it is too late.  Make sure your health care provider performs a complete foot exam at least annually or more often if you have foot problems. Report any cuts, sores, or bruises to your health care provider immediately. SEEK MEDICAL CARE IF:   You have an injury that is not healing.  You have cuts or breaks in the skin.  You have an ingrown nail.  You notice redness on your legs or feet.  You feel burning or tingling in your legs or feet.  You have pain or cramps in your legs and feet.  Your legs or feet are numb.  Your feet always feel cold. SEEK IMMEDIATE MEDICAL CARE IF:   There is increasing redness,   swelling, or pain in or around a wound.  There is a red line that goes up your leg.  Pus is coming from a wound.  You develop a fever or as directed by your health care provider.  You notice a bad smell coming from an ulcer or wound. Document Released: 12/23/1999 Document Revised: 08/27/2012 Document Reviewed: 06/03/2012 ExitCare Patient Information 2014 [employer].  

## 2013-05-15 NOTE — Progress Notes (Signed)
She presents today with a chief complaint of painful elongated toenails and calluses bilateral foot.  Objective: Vital signs are stable she is alert and oriented x3. Pulses are palpable bilateral. Nails are thick yellow dystrophic onychomycotic and painful palpation. Reactive hyperkeratosis plantar aspect of the bilateral foot no open lesions noted.  Assessment: Pain in limb secondary to onychomycosis and porokeratotic lesions bilateral.   Plan: debridement of reactive hyperkeratosis and nails 1 through 5 bilateral covered service secondary to pain. Followup with her in 3 months.

## 2013-08-20 ENCOUNTER — Encounter: Payer: Self-pay | Admitting: Podiatry

## 2013-08-20 ENCOUNTER — Ambulatory Visit (INDEPENDENT_AMBULATORY_CARE_PROVIDER_SITE_OTHER): Payer: Medicare Other | Admitting: Podiatry

## 2013-08-20 VITALS — BP 132/86 | HR 84 | Resp 12

## 2013-08-20 DIAGNOSIS — M79609 Pain in unspecified limb: Secondary | ICD-10-CM

## 2013-08-20 DIAGNOSIS — M79676 Pain in unspecified toe(s): Secondary | ICD-10-CM

## 2013-08-20 DIAGNOSIS — B351 Tinea unguium: Secondary | ICD-10-CM

## 2013-08-21 NOTE — Progress Notes (Signed)
She presents today with a chief complaint of painful elongated toenails.  Objective: Nails are thick yellow dystrophic clinically mycotic painful palpation.  Assessment: Pain in limb secondary to onychomycosis 1 through 5 bilateral.  Plan: Debridement of nails 1 through 5 bilateral covered service secondary to pain.

## 2013-08-31 DIAGNOSIS — E119 Type 2 diabetes mellitus without complications: Secondary | ICD-10-CM | POA: Diagnosis not present

## 2013-08-31 DIAGNOSIS — I10 Essential (primary) hypertension: Secondary | ICD-10-CM | POA: Diagnosis not present

## 2013-09-03 DIAGNOSIS — D649 Anemia, unspecified: Secondary | ICD-10-CM | POA: Diagnosis not present

## 2013-09-03 DIAGNOSIS — I10 Essential (primary) hypertension: Secondary | ICD-10-CM | POA: Diagnosis not present

## 2013-09-03 DIAGNOSIS — E119 Type 2 diabetes mellitus without complications: Secondary | ICD-10-CM | POA: Diagnosis not present

## 2013-09-10 DIAGNOSIS — D509 Iron deficiency anemia, unspecified: Secondary | ICD-10-CM | POA: Diagnosis not present

## 2013-09-10 DIAGNOSIS — K5900 Constipation, unspecified: Secondary | ICD-10-CM | POA: Diagnosis not present

## 2013-09-10 DIAGNOSIS — Z1211 Encounter for screening for malignant neoplasm of colon: Secondary | ICD-10-CM | POA: Diagnosis not present

## 2013-09-29 DIAGNOSIS — Z961 Presence of intraocular lens: Secondary | ICD-10-CM | POA: Diagnosis not present

## 2013-09-29 DIAGNOSIS — H2589 Other age-related cataract: Secondary | ICD-10-CM | POA: Diagnosis not present

## 2013-09-29 DIAGNOSIS — Z23 Encounter for immunization: Secondary | ICD-10-CM | POA: Diagnosis not present

## 2013-09-29 DIAGNOSIS — E119 Type 2 diabetes mellitus without complications: Secondary | ICD-10-CM | POA: Diagnosis not present

## 2013-09-29 DIAGNOSIS — H4011X0 Primary open-angle glaucoma, stage unspecified: Secondary | ICD-10-CM | POA: Diagnosis not present

## 2013-10-02 DIAGNOSIS — E119 Type 2 diabetes mellitus without complications: Secondary | ICD-10-CM | POA: Diagnosis not present

## 2013-10-02 DIAGNOSIS — I10 Essential (primary) hypertension: Secondary | ICD-10-CM | POA: Diagnosis not present

## 2013-10-06 DIAGNOSIS — Z0000 Encounter for general adult medical examination without abnormal findings: Secondary | ICD-10-CM | POA: Diagnosis not present

## 2013-10-06 DIAGNOSIS — N390 Urinary tract infection, site not specified: Secondary | ICD-10-CM | POA: Diagnosis not present

## 2013-10-30 DIAGNOSIS — D509 Iron deficiency anemia, unspecified: Secondary | ICD-10-CM | POA: Diagnosis not present

## 2013-10-30 DIAGNOSIS — Z1211 Encounter for screening for malignant neoplasm of colon: Secondary | ICD-10-CM | POA: Diagnosis not present

## 2013-11-18 ENCOUNTER — Encounter: Payer: Self-pay | Admitting: Family

## 2013-11-19 ENCOUNTER — Ambulatory Visit (HOSPITAL_COMMUNITY)
Admission: RE | Admit: 2013-11-19 | Discharge: 2013-11-19 | Disposition: A | Discharge status: Home / Self Care | Payer: Medicare Other | Source: Ambulatory Visit | Attending: Family | Admitting: Family

## 2013-11-19 ENCOUNTER — Encounter: Payer: Self-pay | Admitting: Family

## 2013-11-19 ENCOUNTER — Ambulatory Visit (INDEPENDENT_AMBULATORY_CARE_PROVIDER_SITE_OTHER): Payer: Medicare Other | Admitting: Family

## 2013-11-19 ENCOUNTER — Encounter (INDEPENDENT_AMBULATORY_CARE_PROVIDER_SITE_OTHER): Payer: Self-pay

## 2013-11-19 VITALS — BP 135/71 | HR 83 | Resp 16 | Ht 68.0 in | Wt 160.0 lb

## 2013-11-19 DIAGNOSIS — I6523 Occlusion and stenosis of bilateral carotid arteries: Secondary | ICD-10-CM

## 2013-11-19 NOTE — Patient Instructions (Signed)
Stroke Prevention Some medical conditions and behaviors are associated with an increased chance of having a stroke. You may prevent a stroke by making healthy choices and managing medical conditions. HOW CAN I REDUCE MY RISK OF HAVING A STROKE?   Stay physically active. Get at least 30 minutes of activity on most or all days.  Do not smoke. It may also be helpful to avoid exposure to secondhand smoke.  Limit alcohol use. Moderate alcohol use is considered to be:  No more than 2 drinks per day for men.  No more than 1 drink per day for nonpregnant women.  Eat healthy foods. This involves:  Eating 5 or more servings of fruits and vegetables a day.  Making dietary changes that address high blood pressure (hypertension), high cholesterol, diabetes, or obesity.  Manage your cholesterol levels.  Making food choices that are high in fiber and low in saturated fat, trans fat, and cholesterol may control cholesterol levels.  Take any prescribed medicines to control cholesterol as directed by your health care provider.  Manage your diabetes.  Controlling your carbohydrate and sugar intake is recommended to manage diabetes.  Take any prescribed medicines to control diabetes as directed by your health care provider.  Control your hypertension.  Making food choices that are low in salt (sodium), saturated fat, trans fat, and cholesterol is recommended to manage hypertension.  Take any prescribed medicines to control hypertension as directed by your health care provider.  Maintain a healthy weight.  Reducing calorie intake and making food choices that are low in sodium, saturated fat, trans fat, and cholesterol are recommended to manage weight.  Stop drug abuse.  Avoid taking birth control pills.  Talk to your health care provider about the risks of taking birth control pills if you are over 35 years old, smoke, get migraines, or have ever had a blood clot.  Get evaluated for sleep  disorders (sleep apnea).  Talk to your health care provider about getting a sleep evaluation if you snore a lot or have excessive sleepiness.  Take medicines only as directed by your health care provider.  For some people, aspirin or blood thinners (anticoagulants) are helpful in reducing the risk of forming abnormal blood clots that can lead to stroke. If you have the irregular heart rhythm of atrial fibrillation, you should be on a blood thinner unless there is a good reason you cannot take them.  Understand all your medicine instructions.  Make sure that other conditions (such as anemia or atherosclerosis) are addressed. SEEK IMMEDIATE MEDICAL CARE IF:   You have sudden weakness or numbness of the face, arm, or leg, especially on one side of the body.  Your face or eyelid droops to one side.  You have sudden confusion.  You have trouble speaking (aphasia) or understanding.  You have sudden trouble seeing in one or both eyes.  You have sudden trouble walking.  You have dizziness.  You have a loss of balance or coordination.  You have a sudden, severe headache with no known cause.  You have new chest pain or an irregular heartbeat. Any of these symptoms may represent a serious problem that is an emergency. Do not wait to see if the symptoms will go away. Get medical help at once. Call your local emergency services (911 in [place] Do not drive yourself to the hospital. Document Released: 02/02/2004 Document Revised: 05/11/2013 Document Reviewed: 06/27/2012 ExitCare Patient Information 2015 [employer]. This information is not intended to replace advice given   to you by your health care provider. Make sure you discuss any questions you have with your health care provider.  

## 2013-11-19 NOTE — Addendum Note (Signed)
Addended by: Sharee PimpleMCCHESNEY, MARILYN K on: 11/19/2013 03:19 PM   Modules accepted: Orders

## 2013-11-19 NOTE — Progress Notes (Signed)
Established Carotid Patient   History of Present Illness  [name] patient of Dr. Edilia Boickson with no history of carotid intervention who returns today for routine carotid artery surveillance. Mild/moderate bilateral carotid stenosis was found on carotid Duplex a year ago; there was some question as to whether the stenosis could be fibromuscular dysplasia instead of atherosclerosis.   Patient has Negative history of TIA or stroke symptom. The patient denies amaurosis fugax or monocular blindness. The patient denies facial drooping. Pt. denies hemiplegia. The patient denies receptive or expressive aphasia. Pt. denies extremity weakness. Patient denies cardiac problems, denies claudication symptoms, denies non-healing wounds.  She denies claudication symptoms with walking.  Patient denies New Medical or Surgical History.  Pt Diabetic: Yes, recently diagnosed, in good control per pt. Pt smoker: non-smoker  Pt meds include: Statin : Yes, simvastatin  ASA: Yes Other anticoagulants/antiplatelets: no  Past Medical History  Diagnosis Date  . Diabetes mellitus   . Hyperlipidemia   . Hypertension   . Carotid artery occlusion     Social History History  Substance Use Topics  . Smoking status: Never Smoker   . Smokeless tobacco: Never Used  . Alcohol Use: No    Family History Family History  Problem Relation Age of Onset  . Hyperlipidemia Mother   . Hypertension Mother     Surgical History Past Surgical History  Procedure Laterality Date  . Abdominal hysterectomy      No Known Allergies  Current Outpatient Prescriptions  Medication Sig Dispense Refill  . aspirin 325 MG EC tablet Take 325 mg by mouth daily.      Marland Kitchen. aspirin 81 MG tablet Take 81 mg by mouth daily.    . calcium-vitamin D (OSCAL WITH D) 500-200 MG-UNIT per tablet Take 1 tablet by mouth daily.      . dorzolamide-timolol (COSOPT) 22.3-6.8 MG/ML ophthalmic solution Twice daily.     Marland Kitchen. ezetimibe-simvastatin (VYTORIN) 10-40 MG per tablet Take 1 tablet by mouth at bedtime.      . irbesartan-hydrochlorothiazide (AVALIDE) 150-12.5 MG per tablet Take 1 tablet by mouth daily.      Marland Kitchen. KLOR-CON M20 20 MEQ tablet Take 20 mEq by mouth daily.    Marland Kitchen. latanoprost (XALATAN) 0.005 % ophthalmic solution 1 drop at bedtime.      . Multiple Vitamin (MULTIVITAMIN) tablet Take 1 tablet by mouth daily.      . nateglinide (STARLIX) 60 MG tablet Take 60 mg by mouth 3 (three) times daily before meals.      . niacin (NIASPAN) 500 MG CR tablet Take 500 mg by mouth at bedtime.      Marland Kitchen. PRODIGY NO CODING BLOOD GLUC test strip     . PRODIGY TWIST TOP LANCETS 28G MISC     . simvastatin (ZOCOR) 40 MG tablet daily.    . sitaGLIPtan-metformin (JANUMET) 50-1000 MG per tablet Take 1 tablet by mouth 2 (two) times daily with a meal.       No current facility-administered medications for this visit.    Review of Systems : See HPI for pertinent positives and negatives.  Physical Examination  Filed Vitals:   11/19/13 1439 11/19/13 1441  BP: 146/71 135/71  Pulse: 84 83  Resp: 16   Height: 5\' 8"  (1.727 m)   Weight: 160 lb (72.576 kg)    Body mass index is 24.33 kg/(m^2).  General: WDWN [name] in NAD GAIT: normal Eyes: PERRLA Pulmonary: CTAB, Negative Rales, Negative rhonchi, & Negative wheezing.  Cardiac: regular rhythm, no detected murmur  VASCULAR EXAM Carotid Bruits Left Right   Negative Negative   Aorta is not palpable. Radial pulses are 2+ palpable and equal.      LE Pulses LEFT RIGHT   FEMORAL  palpable  palpable    POPLITEAL not palpable  not palpable   POSTERIOR TIBIAL not palpable   palpable    DORSALIS PEDIS  ANTERIOR TIBIAL palpable  not palpable     Gastrointestinal: soft, nontender, BS WNL, no  r/g,no palpated masses.  Musculoskeletal: Negative muscle atrophy/wasting. M/S 5/5 throughout, Extremities without ischemic changes.  Neurologic: A&O X 3; Appropriate Affect ; SENSATION ;normal;  Speech is normal CN 2-12 intact, Pain and light touch intact in extremities, Motor exam as listed above.     Non-Invasive Vascular Imaging CAROTID DUPLEX 11/19/2013   Bilateral ICA's: <40% stenosis.  These findings are Unchanged from previous exam.   Assessment: [name] is a 79 y.o. [name] who presents with asymptomatic minimal bilateral ICA stenoses. The  ICA stenosis is  Unchanged from previous exam. This may represent fibromuscular dysplasia. Patient denies known problem with kidney function and her blood pressure is controlled.  Plan: Follow-up in 1 year with Carotid Duplex.   I discussed in depth with the patient the nature of atherosclerosis, and emphasized the importance of maximal medical management including strict control of blood pressure, blood glucose, and lipid levels, obtaining regular exercise, and continued cessation of smoking.  The patient is aware that without maximal medical management the underlying atherosclerotic disease process will progress, limiting the benefit of any interventions. The patient was given information about stroke prevention and what symptoms should prompt the patient to seek immediate medical care. Thank you for allowing us to participate in this patient's care.  Charisse MarchSuzanne Nickel, RN, MSN, FNP-C [facility] Office: [phone]  Clinic Physician: Darrick PennaFields  11/19/2013 2:42 PM

## 2013-11-26 ENCOUNTER — Ambulatory Visit (INDEPENDENT_AMBULATORY_CARE_PROVIDER_SITE_OTHER): Payer: Medicare Other | Admitting: Podiatry

## 2013-11-26 DIAGNOSIS — B351 Tinea unguium: Secondary | ICD-10-CM | POA: Diagnosis not present

## 2013-11-26 DIAGNOSIS — M79676 Pain in unspecified toe(s): Secondary | ICD-10-CM | POA: Diagnosis not present

## 2013-11-26 DIAGNOSIS — Q828 Other specified congenital malformations of skin: Secondary | ICD-10-CM | POA: Diagnosis not present

## 2013-11-26 NOTE — Progress Notes (Signed)
Presents today chief complaint of painful elongated toenails and a painful lesion plantar aspect fifth metatarsal head of the left foot.  Objective: Pulses are palpable bilateral nails are thick, yellow dystrophic onychomycosis and painful palpation. Porokeratosis some fifth metatarsal head left foot. Without ulceration without infection.  Assessment: Onychomycosis with pain in limb. Porokeratosis plantar aspect left foot.  Plan: Treatment of nails in thickness and length as covered service secondary to pain. Debrided reactive hyperkeratosis plantar aspect left foot.

## 2013-12-10 DIAGNOSIS — Z1231 Encounter for screening mammogram for malignant neoplasm of breast: Secondary | ICD-10-CM | POA: Diagnosis not present

## 2014-02-04 DIAGNOSIS — E782 Mixed hyperlipidemia: Secondary | ICD-10-CM | POA: Diagnosis not present

## 2014-02-04 DIAGNOSIS — I10 Essential (primary) hypertension: Secondary | ICD-10-CM | POA: Diagnosis not present

## 2014-02-11 DIAGNOSIS — Z961 Presence of intraocular lens: Secondary | ICD-10-CM | POA: Diagnosis not present

## 2014-02-11 DIAGNOSIS — H4011X3 Primary open-angle glaucoma, severe stage: Secondary | ICD-10-CM | POA: Diagnosis not present

## 2014-02-11 DIAGNOSIS — H25812 Combined forms of age-related cataract, left eye: Secondary | ICD-10-CM | POA: Diagnosis not present

## 2014-02-11 DIAGNOSIS — H4011X1 Primary open-angle glaucoma, mild stage: Secondary | ICD-10-CM | POA: Diagnosis not present

## 2014-02-25 ENCOUNTER — Ambulatory Visit (INDEPENDENT_AMBULATORY_CARE_PROVIDER_SITE_OTHER): Payer: Medicare Other | Admitting: Podiatry

## 2014-02-25 DIAGNOSIS — B351 Tinea unguium: Secondary | ICD-10-CM | POA: Diagnosis not present

## 2014-02-25 DIAGNOSIS — M79676 Pain in unspecified toe(s): Secondary | ICD-10-CM | POA: Diagnosis not present

## 2014-02-25 DIAGNOSIS — Q828 Other specified congenital malformations of skin: Secondary | ICD-10-CM

## 2014-02-25 NOTE — Progress Notes (Signed)
Presents today chief complaint of painful elongated toenails and a painful lesion plantar aspect fifth metatarsal head of the left foot.  Objective: Pulses are palpable bilateral nails are thick, yellow dystrophic onychomycosis and painful palpation. Porokeratosis some fifth metatarsal head left foot. Without ulceration without infection.  Assessment: Onychomycosis with pain in limb. Porokeratosis plantar aspect left foot.  Plan: Treatment of nails in thickness and length as covered service secondary to pain. Debrided reactive hyperkeratosis plantar aspect left foot.  

## 2014-05-27 ENCOUNTER — Ambulatory Visit (INDEPENDENT_AMBULATORY_CARE_PROVIDER_SITE_OTHER): Payer: Medicare Other | Admitting: Podiatry

## 2014-05-27 DIAGNOSIS — B351 Tinea unguium: Secondary | ICD-10-CM

## 2014-05-27 DIAGNOSIS — Q828 Other specified congenital malformations of skin: Secondary | ICD-10-CM | POA: Diagnosis not present

## 2014-05-27 DIAGNOSIS — M79673 Pain in unspecified foot: Secondary | ICD-10-CM

## 2014-05-29 NOTE — Progress Notes (Signed)
She presents today with chief complaint of painful elongated toenails 1 through 5 bilateral.  Objective: Nails are thick yellow dystrophic onychomycotic and painful on palpation 1 through 5 bilateral.  Assessment: Pain in limb secondary to onychomycosis 1 through 5 bilateral.  Plan: Debridement of nails 1 through 5 bilateral. Remember to ask her how she likes her new apartment.

## 2014-06-03 DIAGNOSIS — I10 Essential (primary) hypertension: Secondary | ICD-10-CM | POA: Diagnosis not present

## 2014-06-03 DIAGNOSIS — R7309 Other abnormal glucose: Secondary | ICD-10-CM | POA: Diagnosis not present

## 2014-06-08 DIAGNOSIS — E782 Mixed hyperlipidemia: Secondary | ICD-10-CM | POA: Diagnosis not present

## 2014-06-08 DIAGNOSIS — I10 Essential (primary) hypertension: Secondary | ICD-10-CM | POA: Diagnosis not present

## 2014-06-08 DIAGNOSIS — E119 Type 2 diabetes mellitus without complications: Secondary | ICD-10-CM | POA: Diagnosis not present

## 2014-06-09 IMAGING — CT CT ABD-PELV W/ CM
2 of 5 series · 17 of 46 positions shown, 19 images · IV contrast (READICAT/WATER & [ID] OMNI 300)
Comparison: None.

CLINICAL DATA: Elevated LFTs, weight loss, hypokalemia

CT ABDOMEN AND PELVIS WITH CONTRAST
TECHNIQUE: Multidetector CT imaging of the abdomen and pelvis was
performed following the standard protocol during bolus
administration of intravenous contrast.
Contrast: 100mL OMNIPAQUE IOHEXOL 300 MG/ML  SOLN

[Series 2: abd/pelvis with · axial · 0.74mm/px · z∈[-489,-94]mm · 14 of 88 slices shown, 16 images]
[im 5/88  soft-tissue]
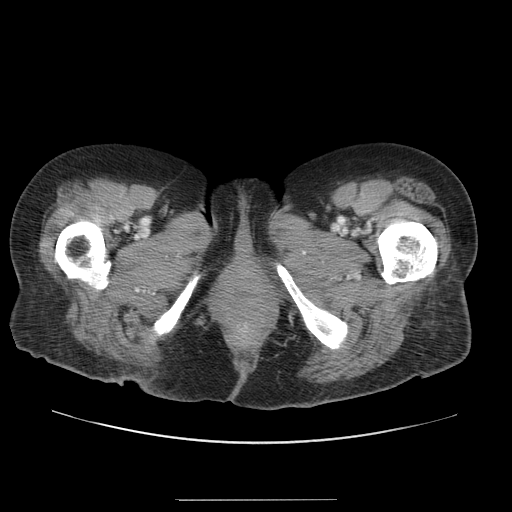
[im 5/88  bone]
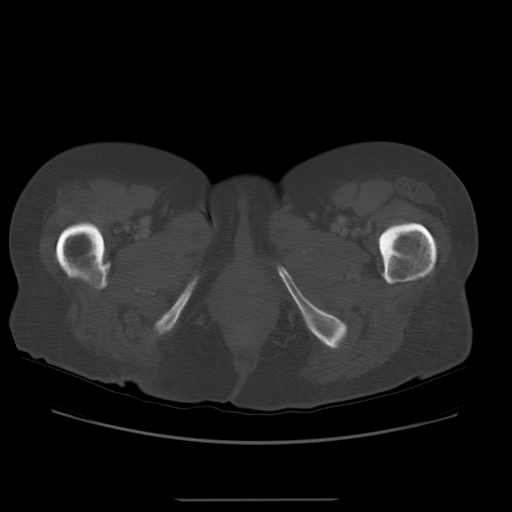
[im 14/88  soft-tissue]
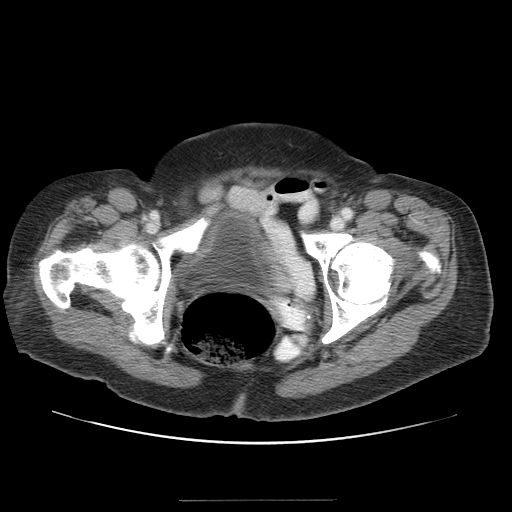
[im 18/88  soft-tissue]
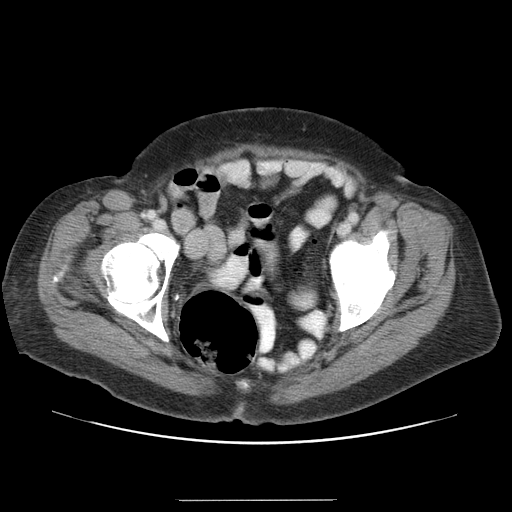
[im 22/88  soft-tissue]
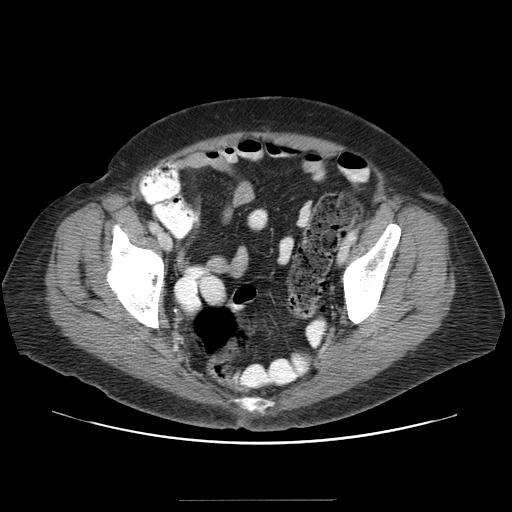
[im 31/88  soft-tissue]
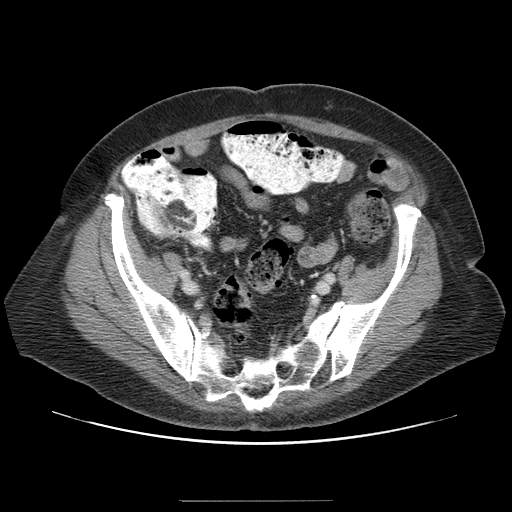
[im 35/88  soft-tissue]
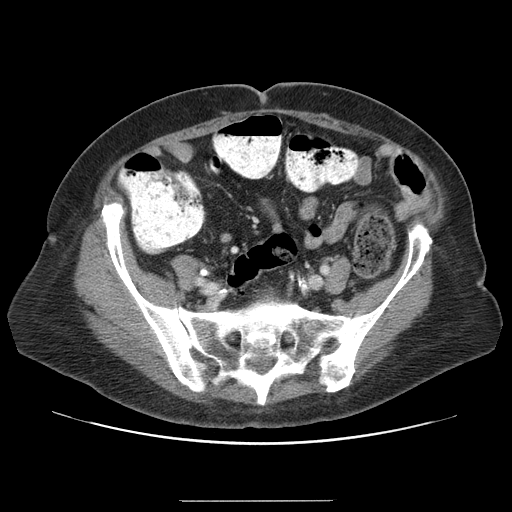
[im 40/88  soft-tissue]
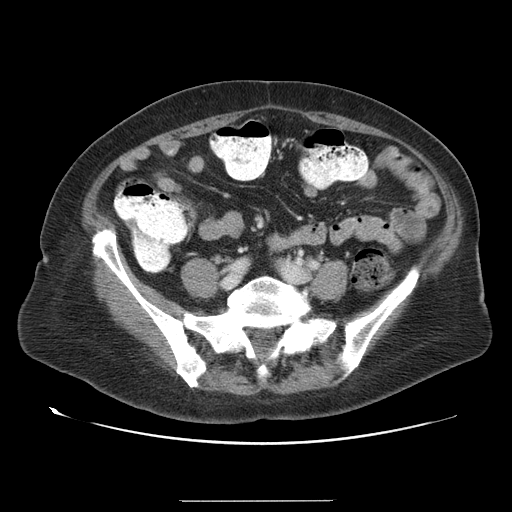
[im 48/88  soft-tissue]
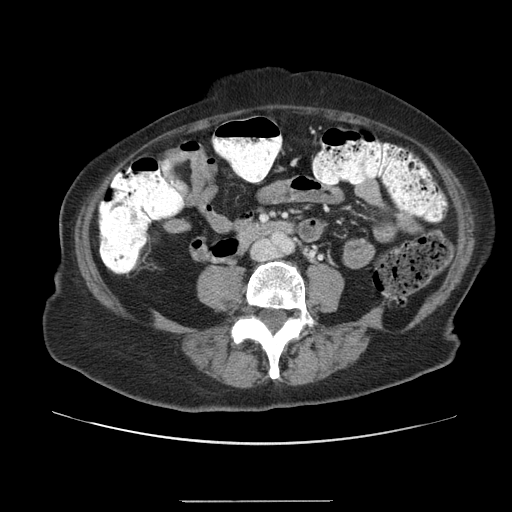
[im 53/88  soft-tissue]
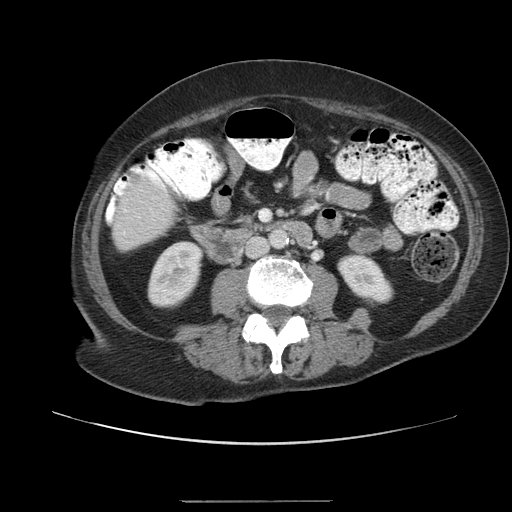
[im 53/88  bone]
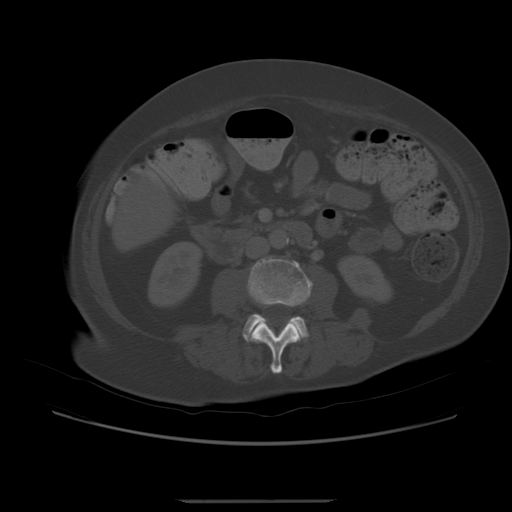
[im 57/88  soft-tissue]
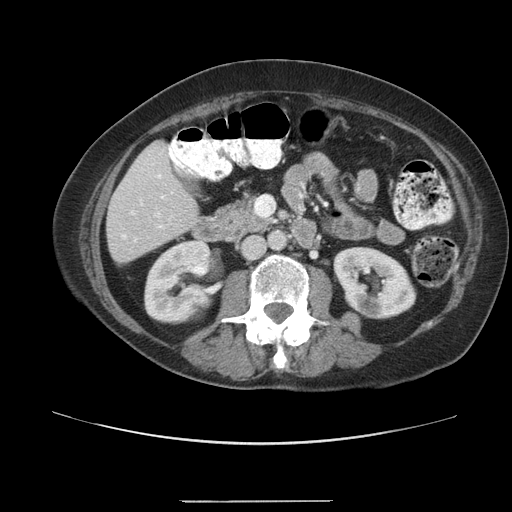
[im 66/88  soft-tissue]
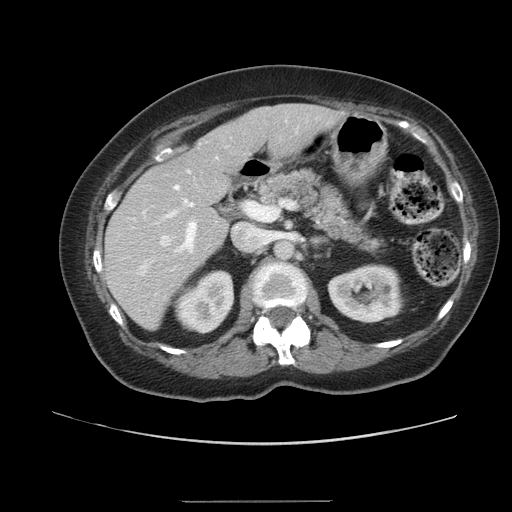
[im 70/88  soft-tissue]
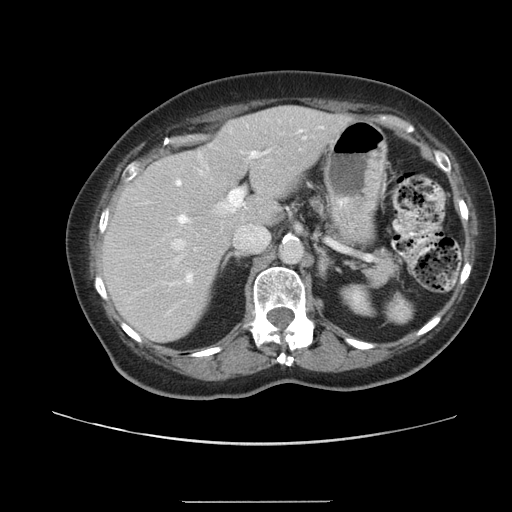
[im 74/88  soft-tissue]
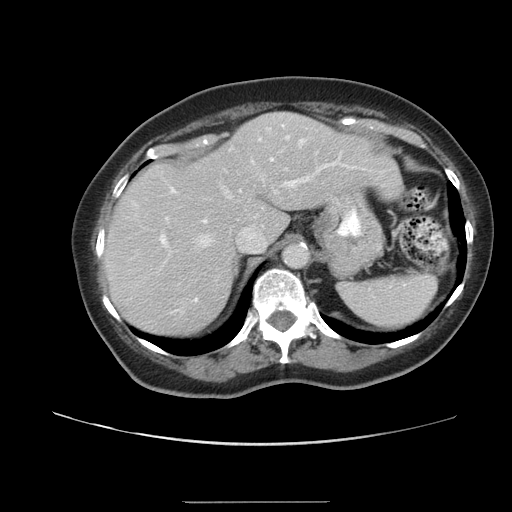
[im 83/88  soft-tissue]
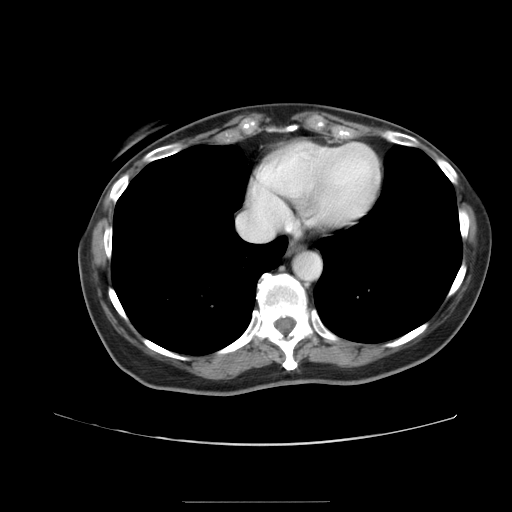

[Series 400: coronals · coronal · 0.88mm/px · 3 of 141 slices shown]
[im 47/141  soft-tissue]
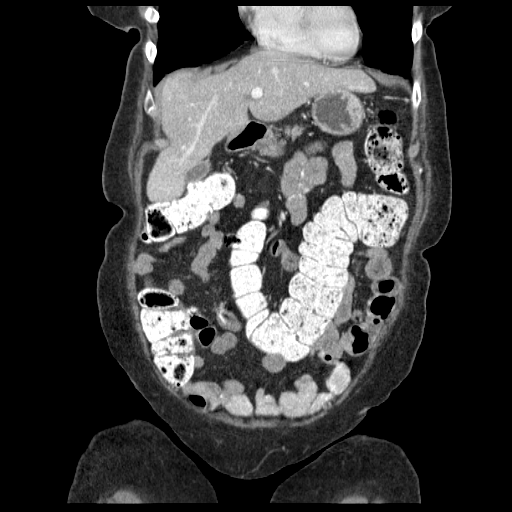
[im 63/141  soft-tissue]
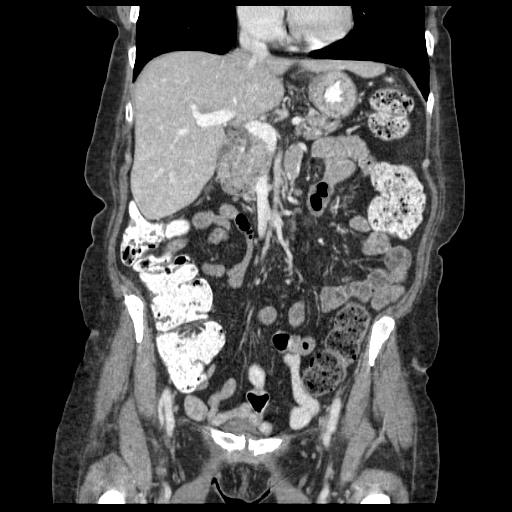
[im 78/141  soft-tissue]
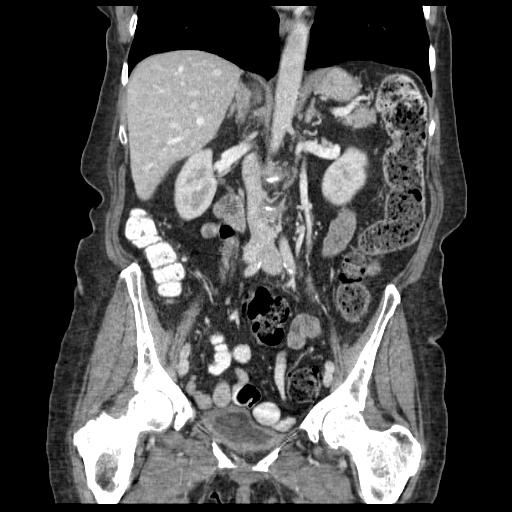

[17 of 46 positions shown; findings below may reference images not displayed]

FINDINGS: Lung bases are clear.

Liver is within normal limits.  No suspicious/enhancing hepatic
lesions.

Spleen, pancreas, and adrenal glands are within normal limits.

Gallbladder is unremarkable.  No intrahepatic or extrahepatic
ductal dilatation.

2.0 cm left lower pole renal cyst (series 5/image 17).  1.5 cm
posterior interpolar right renal cyst (series 5/image 15).
Additional subcentimeter probable renal cysts bilaterally.  No
hydronephrosis.

No evidence of bowel obstruction. Normal appendix.  Colonic
diverticulosis, without associate inflammatory changes.

Atherosclerotic calcifications of the abdominal aorta and branch
vessels.

No abdominopelvic ascites.

No suspicious abdominopelvic lymphadenopathy.

Status post hysterectomy.  No adnexal masses.

Bladder is mildly thick-walled but underdistended.

Mild degenerative changes of the visualized thoracolumbar spine.
IMPRESSION: No acute findings in the abdomen/pelvis.

## 2014-08-18 DIAGNOSIS — H25812 Combined forms of age-related cataract, left eye: Secondary | ICD-10-CM | POA: Diagnosis not present

## 2014-08-18 DIAGNOSIS — H4011X1 Primary open-angle glaucoma, mild stage: Secondary | ICD-10-CM | POA: Diagnosis not present

## 2014-08-18 DIAGNOSIS — H4011X3 Primary open-angle glaucoma, severe stage: Secondary | ICD-10-CM | POA: Diagnosis not present

## 2014-08-18 DIAGNOSIS — E119 Type 2 diabetes mellitus without complications: Secondary | ICD-10-CM | POA: Diagnosis not present

## 2014-08-26 ENCOUNTER — Ambulatory Visit: Payer: Medicare Other | Admitting: Podiatry

## 2014-09-10 DIAGNOSIS — H2512 Age-related nuclear cataract, left eye: Secondary | ICD-10-CM | POA: Diagnosis not present

## 2014-09-20 DIAGNOSIS — H2512 Age-related nuclear cataract, left eye: Secondary | ICD-10-CM | POA: Diagnosis not present

## 2014-09-20 DIAGNOSIS — H25812 Combined forms of age-related cataract, left eye: Secondary | ICD-10-CM | POA: Diagnosis not present

## 2014-10-04 DIAGNOSIS — I10 Essential (primary) hypertension: Secondary | ICD-10-CM | POA: Diagnosis not present

## 2014-10-04 DIAGNOSIS — R7309 Other abnormal glucose: Secondary | ICD-10-CM | POA: Diagnosis not present

## 2014-10-06 DIAGNOSIS — E118 Type 2 diabetes mellitus with unspecified complications: Secondary | ICD-10-CM | POA: Diagnosis not present

## 2014-10-06 DIAGNOSIS — Z23 Encounter for immunization: Secondary | ICD-10-CM | POA: Diagnosis not present

## 2014-10-06 DIAGNOSIS — E43 Unspecified severe protein-calorie malnutrition: Secondary | ICD-10-CM | POA: Diagnosis not present

## 2014-10-06 DIAGNOSIS — I10 Essential (primary) hypertension: Secondary | ICD-10-CM | POA: Diagnosis not present

## 2014-10-06 DIAGNOSIS — R634 Abnormal weight loss: Secondary | ICD-10-CM | POA: Diagnosis not present

## 2014-11-22 ENCOUNTER — Encounter: Payer: Self-pay | Admitting: Family

## 2014-11-24 ENCOUNTER — Ambulatory Visit (HOSPITAL_COMMUNITY)
Admission: RE | Admit: 2014-11-24 | Discharge: 2014-11-24 | Disposition: A | Discharge status: Home / Self Care | Payer: Medicare Other | Source: Ambulatory Visit | Attending: Family | Admitting: Family

## 2014-11-24 ENCOUNTER — Encounter: Payer: Self-pay | Admitting: Family

## 2014-11-24 ENCOUNTER — Ambulatory Visit (INDEPENDENT_AMBULATORY_CARE_PROVIDER_SITE_OTHER): Payer: Medicare Other | Admitting: Family

## 2014-11-24 ENCOUNTER — Other Ambulatory Visit: Payer: Self-pay | Admitting: Family

## 2014-11-24 VITALS — BP 151/78 | HR 81 | Temp 97.4°F | Resp 16 | Ht 68.0 in | Wt 156.0 lb

## 2014-11-24 DIAGNOSIS — I6523 Occlusion and stenosis of bilateral carotid arteries: Secondary | ICD-10-CM | POA: Diagnosis not present

## 2014-11-24 NOTE — Progress Notes (Signed)
Filed Vitals:   11/24/14 1040 11/24/14 1041 11/24/14 1043  BP: 156/78 152/75 151/78  Pulse: 82 81 81  Temp: 97.4 F (36.3 C)    Resp: 16    Height: 5\' 8"  (1.727 m)    Weight: 156 lb (70.761 kg)    SpO2: 99%

## 2014-11-24 NOTE — Progress Notes (Signed)
Established Carotid Patient   History of Present Illness  [name] is a 80 y.o. female patient of Dr. Edilia Boickson with no history of carotid intervention who returns today for routine carotid artery surveillance. Mild/moderate bilateral carotid stenosis was found on carotid Duplex previously; there was some question as to whether the stenosis could be fibromuscular dysplasia instead of atherosclerosis.   The patient has no history of TIA or stroke symptoms.Specifically she denies a history of amaurosis fugax or monocular blindness, unilateral facial drooping, hemiplegia, or receptive or expressive aphasia. She denies cardiac problems, denies claudication symptoms, denies non-healing wounds.  Pt states her blood pressure at home runs 117-130 systolic. She denies claudication symptoms with walking.  Patient denies New Medical or Surgical History.  Pt Diabetic: Yes,  in good control per pt. Pt smoker: non-smoker  Pt meds include: Statin : Yes, simvastatin  ASA: Yes Other anticoagulants/antiplatelets: no    Past Medical History  Diagnosis Date  . Diabetes mellitus   . Hyperlipidemia   . Hypertension   . Carotid artery occlusion     Social History Social History  Substance Use Topics  . Smoking status: Never Smoker   . Smokeless tobacco: Never Used  . Alcohol Use: No    Family History Family History  Problem Relation Age of Onset  . Hyperlipidemia Mother   . Hypertension Mother     Surgical History Past Surgical History  Procedure Laterality Date  . Abdominal hysterectomy      No Known Allergies  Current Outpatient Prescriptions  Medication Sig Dispense Refill  . aspirin 325 MG EC tablet Take 325 mg by mouth daily.      Marland Kitchen. aspirin 81 MG tablet Take 81 mg by mouth daily.    . calcium-vitamin D (OSCAL WITH D) 500-200 MG-UNIT per tablet Take 1 tablet by mouth daily.      . dorzolamide-timolol (COSOPT) 22.3-6.8 MG/ML ophthalmic solution Twice daily.    Marland Kitchen.  ezetimibe-simvastatin (VYTORIN) 10-40 MG per tablet Take 1 tablet by mouth at bedtime.      . irbesartan-hydrochlorothiazide (AVALIDE) 150-12.5 MG per tablet Take 1 tablet by mouth daily.      Marland Kitchen. KLOR-CON M20 20 MEQ tablet Take 20 mEq by mouth daily.    Marland Kitchen. latanoprost (XALATAN) 0.005 % ophthalmic solution 1 drop at bedtime.      . Multiple Vitamin (MULTIVITAMIN) tablet Take 1 tablet by mouth daily.      . nateglinide (STARLIX) 60 MG tablet Take 60 mg by mouth 3 (three) times daily before meals.      . niacin (NIASPAN) 500 MG CR tablet Take 500 mg by mouth at bedtime.      Marland Kitchen. PRODIGY NO CODING BLOOD GLUC test strip     . PRODIGY TWIST TOP LANCETS 28G MISC     . simvastatin (ZOCOR) 40 MG tablet daily.    . sitaGLIPtan-metformin (JANUMET) 50-1000 MG per tablet Take 1 tablet by mouth 2 (two) times daily with a meal.       No current facility-administered medications for this visit.    Review of Systems : See HPI for pertinent positives and negatives.  Physical Examination  Filed Vitals:   11/24/14 1040 11/24/14 1041 11/24/14 1043  BP: 156/78 152/75 151/78  Pulse: 82 81 81  Temp: 97.4 F (36.3 C)    Resp: 16    Height: 5\' 8"  (1.727 m)    Weight: 156 lb (70.761 kg)    SpO2: 99%     Body mass  index is 23.73 kg/(m^2).  General: WDWN female in NAD GAIT: normal Eyes: PERRLA Pulmonary: CTAB, no rales,  rhonchi, or wheezing.  Cardiac: regular rhythm, no detected murmur  VASCULAR EXAM Carotid Bruits Left Right   Negative positive   Aorta is not palpable. Radial pulses are 2+ palpable and equal.      LE Pulses LEFT RIGHT   FEMORAL  palpable  palpable    POPLITEAL not palpable  not palpable   POSTERIOR TIBIAL not palpable   palpable    DORSALIS PEDIS  ANTERIOR TIBIAL palpable  not  palpable     Gastrointestinal: soft, nontender, BS WNL, no r/g,no palpated masses.  Musculoskeletal: Negative muscle atrophy/wasting. M/S 5/5 throughout, Extremities without ischemic changes.  Neurologic: A&O X 3; Appropriate Affect, Speech is normal CN 2-12 intact, Pain and light touch intact in extremities, Motor exam as listed above.          Non-Invasive Vascular Imaging CAROTID DUPLEX 11/24/2014   CEREBROVASCULAR DUPLEX EVALUATION    INDICATION: Carotid artery stenosis    PREVIOUS INTERVENTION(S): NA    DUPLEX EXAM:     RIGHT  LEFT  Peak Systolic Velocities (cm/s) End Diastolic Velocities (cm/s) Plaque LOCATION Peak Systolic Velocities (cm/s) End Diastolic Velocities (cm/s) Plaque  122 11  CCA PROXIMAL 132 18   90 14  CCA MID 87 16   72 13  CCA DISTAL 75 13 HT  108 5  ECA 64 7 HT  77 19  ICA PROXIMAL 49 14 HT  87 23  ICA MID 70 17   186 35  ICA DISTAL 113 23     0.86 ICA / CCA Ratio (PSV) 0.56  Antegrade Vertebral Flow Antegrade  NA Brachial Systolic Pressure (mmHg) NA  NA Brachial Artery Waveforms NA    Plaque Morphology:  HM = Homogeneous, HT = Heterogeneous, CP = Calcific Plaque, SP = Smooth Plaque, IP = Irregular Plaque  ADDITIONAL FINDINGS: Right subclavian artery PSV177cm/sec; Left subclavian artery PSV189cm/sec    IMPRESSION: Bilateral internal carotid artery stenosis present of less than 40%. Significant increased systolic velocities of the right distal internal carotid artery most likely due to vessel tortuousity.    Compared to the previous exam:  Essentially unchanged since previous study on 11/19/2013.     Assessment: [name] is a 80 y.o. female who has no history of stroke or TIA. Her carotid arteries stenosis has remained less than 40% since 2012. Right carotid bruit noted on today's exam, not noted previously.  Plan: Follow-up in 1 year with Carotid Duplex scan.   I discussed in depth with the patient the nature of  atherosclerosis, and emphasized the importance of maximal medical management including strict control of blood pressure, blood glucose, and lipid levels, obtaining regular exercise, and continued cessation of smoking.  The patient is aware that without maximal medical management the underlying atherosclerotic disease process will progress, limiting the benefit of any interventions. The patient was given information about stroke prevention and what symptoms should prompt the patient to seek immediate medical care. Thank you for allowing [place] to participate in this patient's care.  Charisse March, RN, MSN, FNP-C [facility] Office: [phone]  Clinic Physician: Edilia Bo  11/24/2014 11:06 AM

## 2014-11-24 NOTE — Patient Instructions (Signed)
Stroke Prevention Some medical conditions and behaviors are associated with an increased chance of having a stroke. You may prevent a stroke by making healthy choices and managing medical conditions. HOW CAN I REDUCE MY RISK OF HAVING A STROKE?   Stay physically active. Get at least 30 minutes of activity on most or all days.  Do not smoke. It may also be helpful to avoid exposure to secondhand smoke.  Limit alcohol use. Moderate alcohol use is considered to be:  No more than 2 drinks per day for men.  No more than 1 drink per day for nonpregnant women.  Eat healthy foods. This involves:  Eating 5 or more servings of fruits and vegetables a day.  Making dietary changes that address high blood pressure (hypertension), high cholesterol, diabetes, or obesity.  Manage your cholesterol levels.  Making food choices that are high in fiber and low in saturated fat, trans fat, and cholesterol may control cholesterol levels.  Take any prescribed medicines to control cholesterol as directed by your health care provider.  Manage your diabetes.  Controlling your carbohydrate and sugar intake is recommended to manage diabetes.  Take any prescribed medicines to control diabetes as directed by your health care provider.  Control your hypertension.  Making food choices that are low in salt (sodium), saturated fat, trans fat, and cholesterol is recommended to manage hypertension.  Ask your health care provider if you need treatment to lower your blood pressure. Take any prescribed medicines to control hypertension as directed by your health care provider.  If you are 18-39 years of age, have your blood pressure checked every 3-5 years. If you are 40 years of age or older, have your blood pressure checked every year.  Maintain a healthy weight.  Reducing calorie intake and making food choices that are low in sodium, saturated fat, trans fat, and cholesterol are recommended to manage  weight.  Stop drug abuse.  Avoid taking birth control pills.  Talk to your health care provider about the risks of taking birth control pills if you are over 35 years old, smoke, get migraines, or have ever had a blood clot.  Get evaluated for sleep disorders (sleep apnea).  Talk to your health care provider about getting a sleep evaluation if you snore a lot or have excessive sleepiness.  Take medicines only as directed by your health care provider.  For some people, aspirin or blood thinners (anticoagulants) are helpful in reducing the risk of forming abnormal blood clots that can lead to stroke. If you have the irregular heart rhythm of atrial fibrillation, you should be on a blood thinner unless there is a good reason you cannot take them.  Understand all your medicine instructions.  Make sure that other conditions (such as anemia or atherosclerosis) are addressed. SEEK IMMEDIATE MEDICAL CARE IF:   You have sudden weakness or numbness of the face, arm, or leg, especially on one side of the body.  Your face or eyelid droops to one side.  You have sudden confusion.  You have trouble speaking (aphasia) or understanding.  You have sudden trouble seeing in one or both eyes.  You have sudden trouble walking.  You have dizziness.  You have a loss of balance or coordination.  You have a sudden, severe headache with no known cause.  You have new chest pain or an irregular heartbeat. Any of these symptoms may represent a serious problem that is an emergency. Do not wait to see if the symptoms will   go away. Get medical help at once. Call your local emergency services (911 in [place] Do not drive yourself to the hospital.   This information is not intended to replace advice given to you by your health care provider. Make sure you discuss any questions you have with your health care provider.   Document Released: 02/02/2004 Document Revised: 01/15/2014 Document Reviewed:  06/27/2012 Elsevier Interactive Patient Education 2016 Elsevier Inc.  

## 2015-02-01 DIAGNOSIS — E782 Mixed hyperlipidemia: Secondary | ICD-10-CM | POA: Diagnosis not present

## 2015-02-01 DIAGNOSIS — R7309 Other abnormal glucose: Secondary | ICD-10-CM | POA: Diagnosis not present

## 2015-02-01 DIAGNOSIS — I10 Essential (primary) hypertension: Secondary | ICD-10-CM | POA: Diagnosis not present

## 2015-02-03 DIAGNOSIS — E119 Type 2 diabetes mellitus without complications: Secondary | ICD-10-CM | POA: Diagnosis not present

## 2015-02-03 DIAGNOSIS — E782 Mixed hyperlipidemia: Secondary | ICD-10-CM | POA: Diagnosis not present

## 2015-02-07 DIAGNOSIS — Z1231 Encounter for screening mammogram for malignant neoplasm of breast: Secondary | ICD-10-CM | POA: Diagnosis not present

## 2015-02-16 DIAGNOSIS — Z961 Presence of intraocular lens: Secondary | ICD-10-CM | POA: Diagnosis not present

## 2015-02-16 DIAGNOSIS — H401111 Primary open-angle glaucoma, right eye, mild stage: Secondary | ICD-10-CM | POA: Diagnosis not present

## 2015-02-16 DIAGNOSIS — H26492 Other secondary cataract, left eye: Secondary | ICD-10-CM | POA: Diagnosis not present

## 2015-02-16 DIAGNOSIS — E119 Type 2 diabetes mellitus without complications: Secondary | ICD-10-CM | POA: Diagnosis not present

## 2015-02-16 DIAGNOSIS — H401123 Primary open-angle glaucoma, left eye, severe stage: Secondary | ICD-10-CM | POA: Diagnosis not present

## 2015-06-01 DIAGNOSIS — E782 Mixed hyperlipidemia: Secondary | ICD-10-CM | POA: Diagnosis not present

## 2015-06-01 DIAGNOSIS — E43 Unspecified severe protein-calorie malnutrition: Secondary | ICD-10-CM | POA: Diagnosis not present

## 2015-06-01 DIAGNOSIS — E119 Type 2 diabetes mellitus without complications: Secondary | ICD-10-CM | POA: Diagnosis not present

## 2015-06-03 DIAGNOSIS — E782 Mixed hyperlipidemia: Secondary | ICD-10-CM | POA: Diagnosis not present

## 2015-06-03 DIAGNOSIS — I10 Essential (primary) hypertension: Secondary | ICD-10-CM | POA: Diagnosis not present

## 2015-06-03 DIAGNOSIS — E119 Type 2 diabetes mellitus without complications: Secondary | ICD-10-CM | POA: Diagnosis not present

## 2015-06-16 DIAGNOSIS — Z961 Presence of intraocular lens: Secondary | ICD-10-CM | POA: Diagnosis not present

## 2015-06-16 DIAGNOSIS — H26492 Other secondary cataract, left eye: Secondary | ICD-10-CM | POA: Diagnosis not present

## 2015-06-16 DIAGNOSIS — H401112 Primary open-angle glaucoma, right eye, moderate stage: Secondary | ICD-10-CM | POA: Diagnosis not present

## 2015-06-16 DIAGNOSIS — H401123 Primary open-angle glaucoma, left eye, severe stage: Secondary | ICD-10-CM | POA: Diagnosis not present

## 2015-06-23 DIAGNOSIS — Z961 Presence of intraocular lens: Secondary | ICD-10-CM | POA: Diagnosis not present

## 2015-06-23 DIAGNOSIS — H26492 Other secondary cataract, left eye: Secondary | ICD-10-CM | POA: Diagnosis not present

## 2015-09-02 ENCOUNTER — Other Ambulatory Visit: Payer: Self-pay

## 2015-10-05 DIAGNOSIS — E782 Mixed hyperlipidemia: Secondary | ICD-10-CM | POA: Diagnosis not present

## 2015-10-05 DIAGNOSIS — Z23 Encounter for immunization: Secondary | ICD-10-CM | POA: Diagnosis not present

## 2015-10-05 DIAGNOSIS — E119 Type 2 diabetes mellitus without complications: Secondary | ICD-10-CM | POA: Diagnosis not present

## 2015-10-05 DIAGNOSIS — D508 Other iron deficiency anemias: Secondary | ICD-10-CM | POA: Diagnosis not present

## 2015-10-26 DIAGNOSIS — Z961 Presence of intraocular lens: Secondary | ICD-10-CM | POA: Diagnosis not present

## 2015-10-26 DIAGNOSIS — H401112 Primary open-angle glaucoma, right eye, moderate stage: Secondary | ICD-10-CM | POA: Diagnosis not present

## 2015-10-26 DIAGNOSIS — H401123 Primary open-angle glaucoma, left eye, severe stage: Secondary | ICD-10-CM | POA: Diagnosis not present

## 2015-11-04 DIAGNOSIS — Z1212 Encounter for screening for malignant neoplasm of rectum: Secondary | ICD-10-CM | POA: Diagnosis not present

## 2015-11-04 DIAGNOSIS — Z1211 Encounter for screening for malignant neoplasm of colon: Secondary | ICD-10-CM | POA: Diagnosis not present

## 2015-11-18 DIAGNOSIS — E538 Deficiency of other specified B group vitamins: Secondary | ICD-10-CM | POA: Diagnosis not present

## 2015-11-18 DIAGNOSIS — E119 Type 2 diabetes mellitus without complications: Secondary | ICD-10-CM | POA: Diagnosis not present

## 2015-11-18 DIAGNOSIS — D649 Anemia, unspecified: Secondary | ICD-10-CM | POA: Diagnosis not present

## 2015-11-18 DIAGNOSIS — I10 Essential (primary) hypertension: Secondary | ICD-10-CM | POA: Diagnosis not present

## 2015-11-18 DIAGNOSIS — E782 Mixed hyperlipidemia: Secondary | ICD-10-CM | POA: Diagnosis not present

## 2015-11-24 ENCOUNTER — Ambulatory Visit: Payer: Self-pay | Admitting: Family

## 2015-11-24 ENCOUNTER — Encounter (HOSPITAL_COMMUNITY): Payer: Self-pay

## 2016-01-20 DIAGNOSIS — F0280 Dementia in other diseases classified elsewhere without behavioral disturbance: Secondary | ICD-10-CM | POA: Diagnosis not present

## 2016-01-20 DIAGNOSIS — I10 Essential (primary) hypertension: Secondary | ICD-10-CM | POA: Diagnosis not present

## 2016-02-28 DIAGNOSIS — E119 Type 2 diabetes mellitus without complications: Secondary | ICD-10-CM | POA: Diagnosis not present

## 2016-02-28 DIAGNOSIS — H401123 Primary open-angle glaucoma, left eye, severe stage: Secondary | ICD-10-CM | POA: Diagnosis not present

## 2016-02-28 DIAGNOSIS — H401112 Primary open-angle glaucoma, right eye, moderate stage: Secondary | ICD-10-CM | POA: Diagnosis not present

## 2016-02-28 DIAGNOSIS — Z961 Presence of intraocular lens: Secondary | ICD-10-CM | POA: Diagnosis not present

## 2016-03-06 DIAGNOSIS — D649 Anemia, unspecified: Secondary | ICD-10-CM | POA: Diagnosis not present

## 2016-03-06 DIAGNOSIS — E782 Mixed hyperlipidemia: Secondary | ICD-10-CM | POA: Diagnosis not present

## 2016-03-06 DIAGNOSIS — I10 Essential (primary) hypertension: Secondary | ICD-10-CM | POA: Diagnosis not present

## 2016-03-06 DIAGNOSIS — E119 Type 2 diabetes mellitus without complications: Secondary | ICD-10-CM | POA: Diagnosis not present

## 2016-05-10 DIAGNOSIS — E119 Type 2 diabetes mellitus without complications: Secondary | ICD-10-CM | POA: Diagnosis not present

## 2016-05-10 DIAGNOSIS — F0280 Dementia in other diseases classified elsewhere without behavioral disturbance: Secondary | ICD-10-CM | POA: Diagnosis not present

## 2016-05-10 DIAGNOSIS — Z6826 Body mass index (BMI) 26.0-26.9, adult: Secondary | ICD-10-CM | POA: Diagnosis not present

## 2016-05-10 DIAGNOSIS — I10 Essential (primary) hypertension: Secondary | ICD-10-CM | POA: Diagnosis not present

## 2016-05-14 ENCOUNTER — Telehealth: Payer: Self-pay | Admitting: Psychology

## 2016-05-29 ENCOUNTER — Ambulatory Visit: Payer: Self-pay | Admitting: Psychology

## 2016-06-18 ENCOUNTER — Encounter: Payer: Medicare Other | Attending: Psychology | Admitting: Psychology

## 2016-06-18 DIAGNOSIS — I6529 Occlusion and stenosis of unspecified carotid artery: Secondary | ICD-10-CM | POA: Diagnosis not present

## 2016-06-18 DIAGNOSIS — E785 Hyperlipidemia, unspecified: Secondary | ICD-10-CM | POA: Diagnosis not present

## 2016-06-18 DIAGNOSIS — E119 Type 2 diabetes mellitus without complications: Secondary | ICD-10-CM | POA: Insufficient documentation

## 2016-06-18 DIAGNOSIS — R413 Other amnesia: Secondary | ICD-10-CM | POA: Insufficient documentation

## 2016-06-18 DIAGNOSIS — I10 Essential (primary) hypertension: Secondary | ICD-10-CM | POA: Diagnosis not present

## 2016-06-25 DIAGNOSIS — E119 Type 2 diabetes mellitus without complications: Secondary | ICD-10-CM | POA: Diagnosis not present

## 2016-06-25 DIAGNOSIS — I10 Essential (primary) hypertension: Secondary | ICD-10-CM | POA: Diagnosis not present

## 2016-06-25 DIAGNOSIS — F0280 Dementia in other diseases classified elsewhere without behavioral disturbance: Secondary | ICD-10-CM | POA: Diagnosis not present

## 2016-06-25 DIAGNOSIS — D508 Other iron deficiency anemias: Secondary | ICD-10-CM | POA: Diagnosis not present

## 2016-07-04 DIAGNOSIS — H401121 Primary open-angle glaucoma, left eye, mild stage: Secondary | ICD-10-CM | POA: Diagnosis not present

## 2016-07-04 DIAGNOSIS — Z961 Presence of intraocular lens: Secondary | ICD-10-CM | POA: Diagnosis not present

## 2016-07-04 DIAGNOSIS — H401123 Primary open-angle glaucoma, left eye, severe stage: Secondary | ICD-10-CM | POA: Diagnosis not present

## 2016-07-13 ENCOUNTER — Encounter: Payer: Self-pay | Admitting: Psychology

## 2016-07-13 NOTE — Progress Notes (Signed)
Neuropsychological Consultation   Patient:   [name]   DOB:   [birth year]  MR Number:  [id number]  Location:  [facility] [address], [place] [address] [id number] [place] [id number] Dept: [phone]           Date of Service:   06/18/2016  Start Time:   3 PM End Time:   4 PM  Provider/Observer:  Arley PhenixJohn Ayvin Lipinski, Psy.D.       Clinical Neuropsychologist       Billing Code/Service: Neurobehavioral status exam  Chief Complaint:    The patient was referred by Dr. Parke SimmersBland for neuropsychological consultation and assessment. The patient and the patient's daughter both report that there have been some memory issues and the patient and her daughter to look at issues making sure that current medications are appropriate and to develop a baseline measure of her memory functioning.  Reason for Service:  [name] is a 82 year old female that was referred by Dr. Parke SimmersBland for neuropsychological consultation. The patient her daughter both report that there've been some mild increases in confusion and memory issues. Both the patient and her daughter deny any issues of depression or anxiety although the patient has decided that she wanted to stop driving but feels this is primarily due to not wanting to keep up with the maintenance with her vehicle. The patient is currently living with her daughter and the daughter is worried about the patient and wanting to make sure that everything is done to help the patient function as well as possible.  Current Status:  The patient is reported of having some mild issues developing with regard to confusion, attentional issues and memory issues. None of these are described as severe.  Reliability of Information: Information derived from a 1 hour face-to-face clinical interview with the patient and her daughter as well as review of available medical  records.  Behavioral Observation: [name]  presents as a 82 y.o.-year-old Right  Female who appeared her stated age. her dress was Appropriate and she was Well Groomed and her manners were Appropriate to the situation.  her participation was indicative of Appropriate and Attentive behaviors.  There were not any physical disabilities noted.  she displayed an appropriate level of cooperation and motivation.     Interactions:    Active Appropriate  Attention:   within normal limits and attention span and concentration were age appropriate  Memory:   within normal limits; recent and remote memory intact  Visuo-spatial:  within normal limits  Speech (Volume):  normal  Speech:   normal; normal  Thought Process:  Coherent  Though Content:  WNL;   Orientation:   person, place, time/date and situation  Judgment:   Good  Planning:   Good  Affect:    Appropriate  Mood:    NA  Insight:   Good  Intelligence:   normal  Marital Status/Living: The patient was born and raised in [place]. The patient is divorced and has a 80 year old son and 80 year old daughters. She currently lives with her daughter Whitney RungMonique and her daughter's husband.  Current Employment: The patient is not working.  Past Employment:    Substance Use:  No concerns of substance abuse are reported.    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 Carotid artery occlusion   . Diabetes mellitus   . Hyperlipidemia   .  Hypertension          Psychiatric History:  The patient denies any prior psychiatric history  Family Med/Psych History:  Family History  Problem Relation Age of Onset  . Hyperlipidemia Mother   . Hypertension Mother     Risk of Suicide/Violence: virtually non-existent   Impression/DX:  [name] is a 82 year old female that was referred for neuropsychological consultation due to the patient's daughter's concerns about her mother and  how her mother was doing. There are descriptions of some mild to moderate memory issues and some issues with confusion. The patient has been treated for diabetes mellitus and is also been diagnosed with an occlusion/stenosis of her carotid artery with no indications of cerebral infarction.  Disposition/Plan:  We will set the patient up for baseline neuropsychological testing utilizing the repeatable RBANS measure.  Diagnosis:    Memory loss of unknown cause         Electronically Signed   _______________________ Arley Phenix, Psy.D.

## 2016-08-02 ENCOUNTER — Encounter: Payer: Medicare Other | Attending: Psychology | Admitting: Psychology

## 2016-08-02 DIAGNOSIS — E119 Type 2 diabetes mellitus without complications: Secondary | ICD-10-CM | POA: Diagnosis not present

## 2016-08-02 DIAGNOSIS — E785 Hyperlipidemia, unspecified: Secondary | ICD-10-CM | POA: Diagnosis not present

## 2016-08-02 DIAGNOSIS — R413 Other amnesia: Secondary | ICD-10-CM

## 2016-08-02 DIAGNOSIS — I6529 Occlusion and stenosis of unspecified carotid artery: Secondary | ICD-10-CM | POA: Insufficient documentation

## 2016-08-02 DIAGNOSIS — I10 Essential (primary) hypertension: Secondary | ICD-10-CM | POA: Diagnosis not present

## 2016-08-07 ENCOUNTER — Encounter: Payer: Self-pay | Admitting: Psychology

## 2016-08-07 NOTE — Progress Notes (Signed)
The patient completed the RBANS today and we will work on a full report to be included in this note once it is completed.

## 2016-08-28 ENCOUNTER — Encounter: Payer: Self-pay | Admitting: Psychology

## 2016-08-28 ENCOUNTER — Telehealth: Payer: Self-pay | Admitting: Psychology

## 2016-08-28 ENCOUNTER — Encounter: Payer: Medicare Other | Attending: Psychology | Admitting: Psychology

## 2016-08-28 DIAGNOSIS — I6529 Occlusion and stenosis of unspecified carotid artery: Secondary | ICD-10-CM | POA: Insufficient documentation

## 2016-08-28 DIAGNOSIS — E119 Type 2 diabetes mellitus without complications: Secondary | ICD-10-CM | POA: Diagnosis not present

## 2016-08-28 DIAGNOSIS — I10 Essential (primary) hypertension: Secondary | ICD-10-CM | POA: Diagnosis not present

## 2016-08-28 DIAGNOSIS — E785 Hyperlipidemia, unspecified: Secondary | ICD-10-CM | POA: Diagnosis not present

## 2016-08-28 DIAGNOSIS — G3184 Mild cognitive impairment, so stated: Secondary | ICD-10-CM | POA: Diagnosis not present

## 2016-08-28 DIAGNOSIS — R413 Other amnesia: Secondary | ICD-10-CM | POA: Diagnosis not present

## 2016-08-28 NOTE — Telephone Encounter (Signed)
Documentation from testing in mail to dr bland from dr Kieth Brightly

## 2016-08-28 NOTE — Progress Notes (Signed)
Neuropsychological Consultation  Patient:  [name]   DOB: [birth year]  MR Number: [id number]  Location: [facility] FOR PAIN AND [facility] MEDICINE [facility] PHYSICAL MEDICINE AND REHABILITATION [address], [place] [address] [id number] [place] [address] Dept: [phone]  Start: 8 AM End: 9 AM  Provider/Observer:     Hershal Coria PSYD  Chief Complaint:      Chief Complaint  Patient presents with  . Memory Loss    Reason For Service:     [name] is a 82 year old female that was referred by Dr. Parke Simmers for neuropsychological consultation. The patient her daughter both report that there've been some mild increases in confusion and memory issues. Both the patient and her daughter deny any issues of depression or anxiety although the patient has decided that she wanted to stop driving but feels this is primarily due to not wanting to keep up with the maintenance with her vehicle. The patient is currently living with her daughter and the daughter is worried about the patient and wanting to make sure that everything is done to help the patient function as well as possible.   Testing Administered:  RBANS  Participation Level:   Active  Participation Quality:  Appropriate and Attentive      Behavioral Observation:  Well Groomed, Alert, and Appropriate.   Test Results:   The results of the current neuropsychological test battery are consistent with what would be defined as mild cognitive impairments. The patient is displaying significant memory deficits for both immediate and delayed memory but she is showing a generally adequate to good attention and concentration, visual spatial ability and language abilities. The patient's daughter is now reporting any significant disturbance of mood and the patient other than stopping driving is able to actively cope with and deal with ADLs and is not showing other signs of dementia and issues or areas such as  language, geographic disorientation, or other neuropsychological areas.     Total Index Score:  RBANS Update Form A Total Scale 68  The patient's performance on this composite of all index cc within this battery showed that she is function in a global area of functioning in the extremely low range of functioning. These deficits on a global scales almost completely accounted for with regard to immediate and delayed memory functions and her not representative of a consistent deterioration across a wide range of neuropsychological areas.     Immediate Memory Index:  RBANS Update Form A Immediate Memory 57  The patient's performance on immediate memory task falls in the extremely low range of functioning. This is a conglomeration of both complex and simple learning tasks primarily of verbal information. The patient's performance is indicative of significantly impaired verbal learning and is likely to indicate that the patient will have difficulty managing her medications, finances or other activities such as cooking.       Visuospatial / Constructional Index:  RBANS Update Form A Visuospatial/ Constructional    87  The patient's performance on visual spatial/constructional tasks falls in the low average range of functioning. The patient does appear to be able to processing using visuospatial information and there are no indications of visual inattention or hemispatial neglect. This performance does not appear to be significantly impaired.        Attention Index:  RBANS Update Form A Attention 103  The patient performed in the average range with regard to the attentional index which is a measure of simple auditory registration as  well as visual scanning and processing speed. The patient appears to be doing very well with basic attention and concentration as well as overall speed of mental operations. In fact, this is a very good score and suggest that she is able to process information quickly and be  able to attend to and react to a wide range of visual and auditory stimuli.        Language Indes:  RBANS Update Form A Language 83  The patient also performed in the low average range with regard to expressive language functions. The patient's primary expressive language abilities appear to be intact and there do not appear to be in the receptive language impairments as well. The patient is able to respond to questions and react appropriately to with comments. There were no indications of significant issues with regard to naming abilities but there was some reduction with regard to overall verbal fluency.      Delayed Memory Index:  RBANS Update Form A Delayed Memory 44  The patient displayed are most severe level of impairment with regard to delayed memory. The patient had deficits with regard to both delayed recall as well as recognition of both verbal and visual information. The patient is clearly indicating difficulties recognizing and retrieving information from long-term memory stores. This level of dysfunction is likely to impact most areas of functioning.  Summary of Results:   Overall, the results of the current neuropsychological test performance are indicative of mild to moderate neuropsychological deficits. The patient produced a global index score falls in the mild to moderate level of impairment. However, there was a great deal of variability within individual index scores. For example, the patient performed in the average range with regard to attention and concentration functions. She also did well with regard to overall speed of mental operations and information processing speed. The patient performed in the low average range with regard to visual spatial abilities and general language functioning. The patient had significant and profound impairments with regard to both immediate memory as well as delayed memory for both visual and auditory information.  As far as individual subtest are  concerned the patient had the greatest difficulties with regard to learning list, recall of auditory information such as story recall and word recall. The patient did particularly well with regard to visual spatial tasks such as copying figures and she also did exceptionally well with regard to auditory encoding measures. Her information processing speed was also well within normal limits.  Impression/Diagnosis:   The results of the current neuropsychological test battery showed great deal of variability within the patient's performance levels. The patient showed exceptionally good attention and concentration/encoding abilities as well as visual spatial abilities and overall speed of mental operations. However, there were some profound deficits with regard to both immediate learning and delayed learning indices as well as word finding and verbal fluency measures. This pattern is not typical of those seen with conditions such as Alzheimer's although that cannot be completely ruled out as this could potentially be an early stage of functioning. The patient does clearly have significant memory deficits but this is contrasted quite heavily by her attentional abilities and overall high functioning with regard to visual spatial abilities and speed of mental operations. The patient does meet the diagnostic criteria for mild cognitive impairment but at this point does not meet the diagnostic criteria for the diagnosis of a particular dementia.  The patient's area of deficits almost falls exclusively with regard to immediate and  delayed learning functions both visually and auditorily. Looking back at the patient's medical records do show that there've been concerns about buildup and/or stenosis of bilateral carotid arteries and in 2015 she did display some mild bilateral carotid stenosis of approximately 40%. There may be further changes in cortical blood flow accounting for some of the specific deficits we are seeing.    Recommendations:   The patient does meet the criterion for diagnosis of mild cognitive impairment but does not have a broad enough range of neuropsychological deficits or general life functioning deficits to meet the full criterion of senile dementia. We will need to do repeat testing in approximately 6-9 months to assess whether there is been any progressive deterioration in the patient's overall neuropsychological functioning. I would also recommend that the patient be referred back for further carotid scanning to see if there is been any progression in her carotid stenosis. There may also be a need for MRI/MRA to assess for particular blood flow changes.  Diagnosis:    Axis I: Memory loss of unknown cause  Mild cognitive impairment   Arley Phenix, Psy.D. Neuropsychologist

## 2016-09-06 ENCOUNTER — Encounter (HOSPITAL_BASED_OUTPATIENT_CLINIC_OR_DEPARTMENT_OTHER): Payer: Medicare Other | Admitting: Psychology

## 2016-09-06 DIAGNOSIS — E785 Hyperlipidemia, unspecified: Secondary | ICD-10-CM | POA: Diagnosis not present

## 2016-09-06 DIAGNOSIS — I6529 Occlusion and stenosis of unspecified carotid artery: Secondary | ICD-10-CM | POA: Diagnosis not present

## 2016-09-06 DIAGNOSIS — G3184 Mild cognitive impairment, so stated: Secondary | ICD-10-CM | POA: Diagnosis not present

## 2016-09-06 DIAGNOSIS — R413 Other amnesia: Secondary | ICD-10-CM

## 2016-09-06 DIAGNOSIS — E119 Type 2 diabetes mellitus without complications: Secondary | ICD-10-CM | POA: Diagnosis not present

## 2016-09-06 DIAGNOSIS — I10 Essential (primary) hypertension: Secondary | ICD-10-CM | POA: Diagnosis not present

## 2016-09-06 NOTE — Progress Notes (Signed)
Today I provided feedback regarding the results of the recent neuropsychological evaluation. We have set up the patient for follow-up repeat neuropsychological evaluation in approximately 8-9 months. I provided a copy of this evaluation to the patient and her daughter and a copy should've been sent to Dr. Parke SimmersBland. Below is the interpretation and recommendations from the formal neuropsychological battery that can be found in his complete form on the visit from 08/28/2016.  Impression/Diagnosis:                     The results of the current neuropsychological test battery showed great deal of variability within the patient's performance levels. The patient showed exceptionally good attention and concentration/encoding abilities as well as visual spatial abilities and overall speed of mental operations. However, there were some profound deficits with regard to both immediate learning and delayed learning indices as well as word finding and verbal fluency measures. This pattern is not typical of those seen with conditions such as Alzheimer's although that cannot be completely ruled out as this could potentially be an early stage of functioning. The patient does clearly have significant memory deficits but this is contrasted quite heavily by her attentional abilities and overall high functioning with regard to visual spatial abilities and speed of mental operations. The patient does meet the diagnostic criteria for mild cognitive impairment but at this point does not meet the diagnostic criteria for the diagnosis of a particular dementia.  The patient's area of deficits almost falls exclusively with regard to immediate and delayed learning functions both visually and auditorily. Looking back at the patient's medical records do show that there've been concerns about buildup and/or stenosis of bilateral carotid arteries and in 2015 she did display some mild bilateral carotid stenosis of approximately 40%. There may be  further changes in cortical blood flow accounting for some of the specific deficits we are seeing.   Recommendations:                          The patient does meet the criterion for diagnosis of mild cognitive impairment but does not have a broad enough range of neuropsychological deficits or general life functioning deficits to meet the full criterion of senile dementia. We will need to do repeat testing in approximately 6-9 months to assess whether there is been any progressive deterioration in the patient's overall neuropsychological functioning. I would also recommend that the patient be referred back for further carotid scanning to see if there is been any progression in her carotid stenosis. There may also be a need for MRI/MRA to assess for particular blood flow changes.  Diagnosis:                               Axis I: Memory loss of unknown cause  Mild cognitive impairment   Whitney PhenixJohn [name], Psy.D. Neuropsychologist

## 2016-10-23 DIAGNOSIS — Z23 Encounter for immunization: Secondary | ICD-10-CM | POA: Diagnosis not present

## 2016-10-23 DIAGNOSIS — E118 Type 2 diabetes mellitus with unspecified complications: Secondary | ICD-10-CM | POA: Diagnosis not present

## 2016-10-23 DIAGNOSIS — F0280 Dementia in other diseases classified elsewhere without behavioral disturbance: Secondary | ICD-10-CM | POA: Diagnosis not present

## 2016-10-23 DIAGNOSIS — I10 Essential (primary) hypertension: Secondary | ICD-10-CM | POA: Diagnosis not present

## 2016-10-24 DIAGNOSIS — H401111 Primary open-angle glaucoma, right eye, mild stage: Secondary | ICD-10-CM | POA: Diagnosis not present

## 2016-10-24 DIAGNOSIS — H401123 Primary open-angle glaucoma, left eye, severe stage: Secondary | ICD-10-CM | POA: Diagnosis not present

## 2016-10-24 DIAGNOSIS — Z961 Presence of intraocular lens: Secondary | ICD-10-CM | POA: Diagnosis not present

## 2016-10-31 DIAGNOSIS — Z9181 History of falling: Secondary | ICD-10-CM | POA: Diagnosis not present

## 2016-10-31 DIAGNOSIS — Z7984 Long term (current) use of oral hypoglycemic drugs: Secondary | ICD-10-CM | POA: Diagnosis not present

## 2016-10-31 DIAGNOSIS — E119 Type 2 diabetes mellitus without complications: Secondary | ICD-10-CM | POA: Diagnosis not present

## 2016-10-31 DIAGNOSIS — I10 Essential (primary) hypertension: Secondary | ICD-10-CM | POA: Diagnosis not present

## 2016-10-31 DIAGNOSIS — G3184 Mild cognitive impairment, so stated: Secondary | ICD-10-CM | POA: Diagnosis not present

## 2016-11-05 DIAGNOSIS — E119 Type 2 diabetes mellitus without complications: Secondary | ICD-10-CM | POA: Diagnosis not present

## 2016-11-05 DIAGNOSIS — Z9181 History of falling: Secondary | ICD-10-CM | POA: Diagnosis not present

## 2016-11-05 DIAGNOSIS — Z7984 Long term (current) use of oral hypoglycemic drugs: Secondary | ICD-10-CM | POA: Diagnosis not present

## 2016-11-05 DIAGNOSIS — G3184 Mild cognitive impairment, so stated: Secondary | ICD-10-CM | POA: Diagnosis not present

## 2016-11-05 DIAGNOSIS — I10 Essential (primary) hypertension: Secondary | ICD-10-CM | POA: Diagnosis not present

## 2016-11-09 DIAGNOSIS — Z9181 History of falling: Secondary | ICD-10-CM | POA: Diagnosis not present

## 2016-11-09 DIAGNOSIS — G3184 Mild cognitive impairment, so stated: Secondary | ICD-10-CM | POA: Diagnosis not present

## 2016-11-09 DIAGNOSIS — Z7984 Long term (current) use of oral hypoglycemic drugs: Secondary | ICD-10-CM | POA: Diagnosis not present

## 2016-11-09 DIAGNOSIS — I10 Essential (primary) hypertension: Secondary | ICD-10-CM | POA: Diagnosis not present

## 2016-11-09 DIAGNOSIS — E119 Type 2 diabetes mellitus without complications: Secondary | ICD-10-CM | POA: Diagnosis not present

## 2016-11-13 ENCOUNTER — Ambulatory Visit (INDEPENDENT_AMBULATORY_CARE_PROVIDER_SITE_OTHER): Payer: Medicare Other | Admitting: Podiatry

## 2016-11-13 ENCOUNTER — Encounter: Payer: Self-pay | Admitting: Podiatry

## 2016-11-13 DIAGNOSIS — L84 Corns and callosities: Secondary | ICD-10-CM | POA: Diagnosis not present

## 2016-11-13 DIAGNOSIS — E119 Type 2 diabetes mellitus without complications: Secondary | ICD-10-CM | POA: Diagnosis not present

## 2016-11-13 DIAGNOSIS — M2042 Other hammer toe(s) (acquired), left foot: Secondary | ICD-10-CM

## 2016-11-13 NOTE — Progress Notes (Signed)
This patient presents the office with chief complaint of a painful skin lesion on the top of her fourth toe left foot.  She says that this area has been painful for the last few months and is becoming more and more uncomfortable wearing shoes.  She says that she has provided no self treatment nor sought any professional help.  She presents the office for an evaluation and treatment of this painful skin lesion.   General Appearance  Alert, conversant and in no acute stress.  Vascular  Dorsalis pedis and posterior pulses are palpable  bilaterally.  Capillary return is within normal limits  Bilaterally. Temperature is within normal limits  Bilaterally  Neurologic  Senn-Weinstein monofilament wire test diminished   bilaterally. Muscle power  Within normal limits bilaterally.  Nails normal. The nails noted with no evidence of any bacterial or fungal infection  Orthopedic  No limitations of motion of motion feet bilaterally.  No crepitus or effusions noted.  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  Skin  normotropic skin with no porokeratosis noted bilaterally.  No signs of infections or ulcers noted.  Heloma durum fourth toe at the PIPJ  Corn secondary to hammertoe bilaterally  ROV  Debride corn.  Explained hammer toe formation and debrided HD.  Padding.  RTC prn.   Helane GuntherGregory Jeneen Doutt DPM

## 2016-11-14 DIAGNOSIS — E119 Type 2 diabetes mellitus without complications: Secondary | ICD-10-CM | POA: Diagnosis not present

## 2016-11-14 DIAGNOSIS — I10 Essential (primary) hypertension: Secondary | ICD-10-CM | POA: Diagnosis not present

## 2016-11-14 DIAGNOSIS — Z9181 History of falling: Secondary | ICD-10-CM | POA: Diagnosis not present

## 2016-11-14 DIAGNOSIS — Z7984 Long term (current) use of oral hypoglycemic drugs: Secondary | ICD-10-CM | POA: Diagnosis not present

## 2016-11-14 DIAGNOSIS — G3184 Mild cognitive impairment, so stated: Secondary | ICD-10-CM | POA: Diagnosis not present

## 2016-11-23 DIAGNOSIS — G3184 Mild cognitive impairment, so stated: Secondary | ICD-10-CM | POA: Diagnosis not present

## 2016-11-23 DIAGNOSIS — I10 Essential (primary) hypertension: Secondary | ICD-10-CM | POA: Diagnosis not present

## 2016-11-23 DIAGNOSIS — Z7984 Long term (current) use of oral hypoglycemic drugs: Secondary | ICD-10-CM | POA: Diagnosis not present

## 2016-11-23 DIAGNOSIS — Z9181 History of falling: Secondary | ICD-10-CM | POA: Diagnosis not present

## 2016-11-23 DIAGNOSIS — E119 Type 2 diabetes mellitus without complications: Secondary | ICD-10-CM | POA: Diagnosis not present

## 2016-11-27 DIAGNOSIS — I10 Essential (primary) hypertension: Secondary | ICD-10-CM | POA: Diagnosis not present

## 2016-11-27 DIAGNOSIS — Z7984 Long term (current) use of oral hypoglycemic drugs: Secondary | ICD-10-CM | POA: Diagnosis not present

## 2016-11-27 DIAGNOSIS — Z9181 History of falling: Secondary | ICD-10-CM | POA: Diagnosis not present

## 2016-11-27 DIAGNOSIS — G3184 Mild cognitive impairment, so stated: Secondary | ICD-10-CM | POA: Diagnosis not present

## 2016-11-27 DIAGNOSIS — E119 Type 2 diabetes mellitus without complications: Secondary | ICD-10-CM | POA: Diagnosis not present

## 2016-12-27 ENCOUNTER — Encounter: Payer: Self-pay | Admitting: Podiatry

## 2016-12-27 ENCOUNTER — Ambulatory Visit (INDEPENDENT_AMBULATORY_CARE_PROVIDER_SITE_OTHER): Payer: Medicare Other | Admitting: Podiatry

## 2016-12-27 DIAGNOSIS — M2042 Other hammer toe(s) (acquired), left foot: Secondary | ICD-10-CM

## 2016-12-27 DIAGNOSIS — M779 Enthesopathy, unspecified: Secondary | ICD-10-CM

## 2016-12-27 MED ORDER — TRIAMCINOLONE ACETONIDE 10 MG/ML IJ SUSP
10.0000 mg | Freq: Once | INTRAMUSCULAR | Status: AC
  Administered 2016-12-27: 10 mg

## 2016-12-27 NOTE — Progress Notes (Signed)
Subjective:   Patient ID: [name], female   DOB: 82 y.o.   MRN: [id number]   HPI Patient presents with inflamed fourth digit left with fluid buildup around the head of the proximal phalanx and pain upon palpation   ROS      Objective:  Physical Exam  Hammertoe deformity fourth left with inflammatory capsule of the interphalangeal joint     Assessment:  Capsulitis with hammertoe and keratotic lesion formation     Plan:  H&P condition reviewed and sedated proximal nerve block of the toe I then injected the interphalangeal joint 2 mg dexamethasone Kenalog debrided lesion applied padding and discussed possible hammertoe repair educated her on this if symptoms do not improve

## 2017-02-08 DIAGNOSIS — E119 Type 2 diabetes mellitus without complications: Secondary | ICD-10-CM | POA: Diagnosis not present

## 2017-02-08 DIAGNOSIS — E782 Mixed hyperlipidemia: Secondary | ICD-10-CM | POA: Diagnosis not present

## 2017-02-08 DIAGNOSIS — E118 Type 2 diabetes mellitus with unspecified complications: Secondary | ICD-10-CM | POA: Diagnosis not present

## 2017-02-08 DIAGNOSIS — I10 Essential (primary) hypertension: Secondary | ICD-10-CM | POA: Diagnosis not present

## 2017-03-05 DIAGNOSIS — Z961 Presence of intraocular lens: Secondary | ICD-10-CM | POA: Diagnosis not present

## 2017-03-05 DIAGNOSIS — H401112 Primary open-angle glaucoma, right eye, moderate stage: Secondary | ICD-10-CM | POA: Diagnosis not present

## 2017-03-05 DIAGNOSIS — H401123 Primary open-angle glaucoma, left eye, severe stage: Secondary | ICD-10-CM | POA: Diagnosis not present

## 2017-03-05 DIAGNOSIS — H26491 Other secondary cataract, right eye: Secondary | ICD-10-CM | POA: Diagnosis not present

## 2017-05-08 DIAGNOSIS — 419620001 Death: Secondary | SNOMED CT | POA: Diagnosis not present

## 2017-05-08 DEATH — deceased

## 2019-04-28 NOTE — Telephone Encounter (Signed)
Close encounter
